# Patient Record
Sex: Female | Born: 2000 | Race: White | Hispanic: No | Marital: Married | Smoking: Never smoker
Health system: Southern US, Community
[De-identification: ages and names within clinical notes are randomized; demographics above are authoritative.]

## PROBLEM LIST (undated history)

## (undated) DIAGNOSIS — T7840XA Allergy, unspecified, initial encounter: Secondary | ICD-10-CM

## (undated) DIAGNOSIS — L709 Acne, unspecified: Secondary | ICD-10-CM

## (undated) DIAGNOSIS — F32A Depression, unspecified: Secondary | ICD-10-CM

## (undated) DIAGNOSIS — Z9189 Other specified personal risk factors, not elsewhere classified: Secondary | ICD-10-CM

## (undated) DIAGNOSIS — J99 Respiratory disorders in diseases classified elsewhere: Secondary | ICD-10-CM

## (undated) HISTORY — DX: Depression, unspecified: F32.A

## (undated) HISTORY — PX: APPENDECTOMY: SHX54

## (undated) HISTORY — PX: TONSILLECTOMY: SUR1361

## (undated) HISTORY — PX: WISDOM TOOTH EXTRACTION: SHX21

## (undated) HISTORY — PX: SHOULDER SURGERY: SHX246

---
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
## 2018-12-27 ENCOUNTER — Other Ambulatory Visit: Payer: Self-pay

## 2018-12-27 DIAGNOSIS — Z20822 Contact with and (suspected) exposure to covid-19: Secondary | ICD-10-CM

## 2018-12-28 LAB — NOVEL CORONAVIRUS, NAA: SARS-CoV-2, NAA: NOT DETECTED

## 2019-05-07 ENCOUNTER — Other Ambulatory Visit: Payer: Self-pay

## 2019-05-07 ENCOUNTER — Ambulatory Visit
Admission: EM | Admit: 2019-05-07 | Discharge: 2019-05-07 | Disposition: A | Discharge status: Home / Self Care | Payer: Managed Care, Other (non HMO) | Attending: Family Medicine | Admitting: Family Medicine

## 2019-05-07 ENCOUNTER — Encounter: Payer: Self-pay | Admitting: Emergency Medicine

## 2019-05-07 DIAGNOSIS — N920 Excessive and frequent menstruation with regular cycle: Secondary | ICD-10-CM

## 2019-05-07 LAB — CBC WITH DIFFERENTIAL/PLATELET
Abs Immature Granulocytes: 0.02 10*3/uL (ref 0.00–0.07)
Basophils Absolute: 0 10*3/uL (ref 0.0–0.1)
Basophils Relative: 0 %
Eosinophils Absolute: 0.1 10*3/uL (ref 0.0–0.5)
Eosinophils Relative: 1 %
HCT: 42.8 % (ref 36.0–46.0)
Hemoglobin: 14.5 g/dL (ref 12.0–15.0)
Immature Granulocytes: 0 %
Lymphocytes Relative: 30 %
Lymphs Abs: 1.9 10*3/uL (ref 0.7–4.0)
MCH: 31.5 pg (ref 26.0–34.0)
MCHC: 33.9 g/dL (ref 30.0–36.0)
MCV: 92.8 fL (ref 80.0–100.0)
Monocytes Absolute: 0.5 10*3/uL (ref 0.1–1.0)
Monocytes Relative: 7 %
Neutro Abs: 3.9 10*3/uL (ref 1.7–7.7)
Neutrophils Relative %: 62 %
Platelets: 259 10*3/uL (ref 150–400)
RBC: 4.61 MIL/uL (ref 3.87–5.11)
RDW: 12.2 % (ref 11.5–15.5)
WBC: 6.4 10*3/uL (ref 4.0–10.5)
nRBC: 0 % (ref 0.0–0.2)

## 2019-05-07 LAB — PREGNANCY, URINE: Preg Test, Ur: NEGATIVE

## 2019-05-07 MED ORDER — NAPROXEN 500 MG PO TABS: 500.0000 mg | ORAL_TABLET | Freq: Two times a day (BID) | ORAL | 0 refills | Status: DC

## 2019-05-07 NOTE — ED Triage Notes (Signed)
Patient stated her period is supposed to start today but yesterday she started having quarter sized clots and then her period started today. She talked with her PCP and they advised her to come here for an ultrasound. Patient c/o lower right side back pain that started yesterday.

## 2019-05-10 NOTE — ED Provider Notes (Signed)
[facility]    CSN: [id number] Arrival date & time: 05/07/19  1341      History   Chief Complaint Chief Complaint  Patient presents with  . Menorrhagia    HPI Ruth Werner is a 19 y.o. female.   19 yo female with a c/o heavy period since yesterday. States she's passed quarter sized clots. States she takes birth control tablets and was supposed to start her period today but started one day earlier and very heavy. Denies any dizziness, shortness of breath, abdominal or pelvic pains, fevers, chills. States she has felt some right sided low back pain.      Past Medical History:  Diagnosis Date  . Acne   . 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   Past Surgical History:  Procedure Laterality Date  . APPENDECTOMY    . SHOULDER SURGERY    . TONSILLECTOMY      OB History   No obstetric history on file.      Home Medications    Prior to Admission medications   Medication Sig Start Date End Date Taking? Authorizing Provider  norethindrone-ethinyl estradiol-iron (ESTROSTEP FE,TILIA FE,TRI-LEGEST FE) 1-20/1-30/1-35 MG-MCG tablet Take 1 tablet by mouth daily.   Yes [provider]  amoxicillin (AMOXIL) 500 MG capsule Take 1 capsule (500 mg total) by mouth 2 (two) times daily. 07/22/17   Renford Dills, NP  EPINEPHrine (EPIPEN 2-PAK) 0.3 mg/0.3 mL IJ SOAJ injection Inject 0.3 mLs (0.3 mg total) into the muscle once. 10/26/14   Jennye Moccasin, MD  naproxen (NAPROSYN) 500 MG tablet Take 1 tablet (500 mg total) by mouth 2 (two) times daily. 05/07/19   Payton Mccallum, MD    Family History History reviewed. No pertinent family history.  Social History Social History   Tobacco Use  . Smoking status: Never Smoker  . Smokeless tobacco: Never Used  Substance Use Topics  . Alcohol use: No  . Drug use: No     Allergies   Patient has no known  allergies.   Review of Systems Review of Systems   Physical Exam Triage Vital Signs ED Triage Vitals  Enc Vitals Group     BP 05/07/19 1402 108/72     Pulse Rate 05/07/19 1402 80     Resp 05/07/19 1402 18     Temp 05/07/19 1402 98.3 F (36.8 C)     Temp Source 05/07/19 1402 Oral     SpO2 05/07/19 1402 100 %     Weight 05/07/19 1400 190 lb (86.2 kg)     Height 05/07/19 1400 6\' 1"  (1.854 m)     Head Circumference --      Peak Flow --      Pain Score 05/07/19 1400 3     Pain Loc --      Pain Edu? --      Excl. in GC? --    No data found.  Updated Vital Signs BP 108/72 (BP Location: Right Arm)   Pulse 80   Temp 98.3 F (36.8 C) (Oral)   Resp 18   Ht 6\' 1"  (1.854 m)   Wt 86.2 kg   LMP 05/07/2019   SpO2 100%   BMI 25.07 kg/m   Visual Acuity Right Eye Distance:   Left Eye Distance:   Bilateral Distance:    Right Eye Near:   Left Eye Near:  Bilateral Near:     Physical Exam Vitals and nursing note reviewed.  Constitutional:      General: She is not in acute distress.    Appearance: She is not diaphoretic.  Cardiovascular:     Rate and Rhythm: Normal rate.     Heart sounds: Normal heart sounds.  Pulmonary:     Effort: Pulmonary effort is normal. No respiratory distress.  Abdominal:     General: Bowel sounds are normal. There is no distension.     Palpations: Abdomen is soft. There is no mass.     Tenderness: There is no abdominal tenderness. There is no right CVA tenderness, left CVA tenderness, guarding or rebound.     Hernia: No hernia is present.  Neurological:     Mental Status: She is alert.      UC Treatments / Results  Labs (all labs ordered are listed, but only abnormal results are displayed) Labs Reviewed  CBC WITH DIFFERENTIAL/PLATELET  PREGNANCY, URINE    EKG   Radiology No results found.  Procedures Procedures (including critical care time)  Medications Ordered in UC Medications - No data to display  Initial Impression /  Assessment and Plan / UC Course  I have reviewed the triage vital signs and the nursing notes.  Pertinent labs & imaging results that were available during my care of the patient were reviewed by me and considered in my medical decision making (see chart for details).      Final Clinical Impressions(s) / UC Diagnoses   Final diagnoses:  Menorrhagia with regular cycle    ED Prescriptions    Medication Sig Dispense Auth. Provider   naproxen (NAPROSYN) 500 MG tablet Take 1 tablet (500 mg total) by mouth 2 (two) times daily. 30 tablet Norval Gable, MD      1. Lab results and diagnosis reviewed with patient 2. rx as per orders above; reviewed possible side effects, interactions, risks and benefits  3. Follow up with PCP and/or GYN next week  4. Follow-up prn if symptoms worsen or don't improve  PDMP not reviewed this encounter.   Norval Gable, MD 05/10/19 1058

## 2019-07-25 ENCOUNTER — Encounter: Payer: Self-pay | Admitting: Emergency Medicine

## 2019-07-25 ENCOUNTER — Ambulatory Visit
Admission: EM | Admit: 2019-07-25 | Discharge: 2019-07-25 | Disposition: A | Discharge status: Home / Self Care | Payer: Managed Care, Other (non HMO) | Attending: Family Medicine | Admitting: Family Medicine

## 2019-07-25 ENCOUNTER — Other Ambulatory Visit: Payer: Self-pay

## 2019-07-25 DIAGNOSIS — H6503 Acute serous otitis media, bilateral: Secondary | ICD-10-CM

## 2019-07-25 DIAGNOSIS — J0100 Acute maxillary sinusitis, unspecified: Secondary | ICD-10-CM | POA: Diagnosis not present

## 2019-07-25 DIAGNOSIS — J0110 Acute frontal sinusitis, unspecified: Secondary | ICD-10-CM

## 2019-07-25 MED ORDER — AMOXICILLIN 875 MG PO TABS: 875.0000 mg | ORAL_TABLET | Freq: Two times a day (BID) | ORAL | 0 refills | Status: DC

## 2019-07-25 NOTE — Discharge Instructions (Signed)
Over the counter steroid nose spray

## 2019-07-25 NOTE — ED Triage Notes (Signed)
Patient c/o left ear pain for the past 4 days.  Patient also reports nasal congestion prior to her left ear pain.  Patient denies fevers.

## 2019-07-25 NOTE — ED Provider Notes (Signed)
[facility]    CSN: [id number] Arrival date & time: 07/25/19  1222      History   Chief Complaint Chief Complaint  Patient presents with  . Otalgia    ;eft    HPI Ruth Werner is a 19 y.o. female.   19 yo female with a c/o bilateral ear pain for the past 4 days as well as nasal congestion, sinus pressure and headache for the past week. Denies any fevers, chills, cough, shortness of breath. Has tried otc oral meds without relief.    Otalgia   Past Medical History:  Diagnosis Date  . Acne   . 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   Past Surgical History:  Procedure Laterality Date  . APPENDECTOMY    . SHOULDER SURGERY    . TONSILLECTOMY      OB History   No obstetric history on file.      Home Medications    Prior to Admission medications   Medication Sig Start Date End Date Taking? Authorizing Provider  norethindrone-ethinyl estradiol-iron (ESTROSTEP FE,TILIA FE,TRI-LEGEST FE) 1-20/1-30/1-35 MG-MCG tablet Take 1 tablet by mouth daily.   Yes [provider]  amoxicillin (AMOXIL) 875 MG tablet Take 1 tablet (875 mg total) by mouth 2 (two) times daily. 07/25/19   Payton Mccallum, MD  EPINEPHrine (EPIPEN 2-PAK) 0.3 mg/0.3 mL IJ SOAJ injection Inject 0.3 mLs (0.3 mg total) into the muscle once. 10/26/14   Jennye Moccasin, MD  naproxen (NAPROSYN) 500 MG tablet Take 1 tablet (500 mg total) by mouth 2 (two) times daily. 05/07/19   Payton Mccallum, MD    Family History Family History  Problem Relation Age of Onset  . Healthy Mother   . Healthy Father     Social History Social History   Tobacco Use  . Smoking status: Never Smoker  . Smokeless tobacco: Never Used  Vaping Use  . Vaping Use: Never used  Substance Use Topics  . Alcohol use: No  . Drug use: No     Allergies   Patient has no known allergies.   Review of  Systems Review of Systems  HENT: Positive for ear pain.      Physical Exam Triage Vital Signs ED Triage Vitals  Enc Vitals Group     BP 07/25/19 1246 111/70     Pulse Rate 07/25/19 1246 88     Resp 07/25/19 1246 14     Temp 07/25/19 1246 98.6 F (37 C)     Temp Source 07/25/19 1246 Oral     SpO2 07/25/19 1246 100 %     Weight 07/25/19 1244 190 lb (86.2 kg)     Height 07/25/19 1244 6\' 1"  (1.854 m)     Head Circumference --      Peak Flow --      Pain Score 07/25/19 1244 3     Pain Loc --      Pain Edu? --      Excl. in GC? --    No data found.  Updated Vital Signs BP 111/70 (BP Location: Left Arm)   Pulse 88   Temp 98.6 F (37 C) (Oral)   Resp 14   Ht 6\' 1"  (1.854 m)   Wt 86.2 kg   SpO2 100%   BMI 25.07 kg/m   Visual Acuity Right Eye Distance:   Left Eye Distance:  Bilateral Distance:    Right Eye Near:   Left Eye Near:    Bilateral Near:     Physical Exam Vitals and nursing note reviewed.  Constitutional:      General: She is not in acute distress.    Appearance: She is not toxic-appearing or diaphoretic.  HENT:     Right Ear: A middle ear effusion is present. Tympanic membrane is erythematous and bulging.     Left Ear: A middle ear effusion is present. Tympanic membrane is erythematous and bulging.     Nose:     Right Sinus: Maxillary sinus tenderness and frontal sinus tenderness present.     Left Sinus: Maxillary sinus tenderness and frontal sinus tenderness present.     Mouth/Throat:     Pharynx: Uvula midline. Posterior oropharyngeal erythema present. No oropharyngeal exudate.  Cardiovascular:     Rate and Rhythm: Normal rate.     Heart sounds: Normal heart sounds.  Pulmonary:     Effort: Pulmonary effort is normal. No respiratory distress.  Musculoskeletal:     Cervical back: Neck supple.  Neurological:     Mental Status: She is alert.      UC Treatments / Results  Labs (all labs ordered are listed, but only abnormal results are  displayed) Labs Reviewed - No data to display  EKG   Radiology No results found.  Procedures Procedures (including critical care time)  Medications Ordered in UC Medications - No data to display  Initial Impression / Assessment and Plan / UC Course  I have reviewed the triage vital signs and the nursing notes.  Pertinent labs & imaging results that were available during my care of the patient were reviewed by me and considered in my medical decision making (see chart for details).      Final Clinical Impressions(s) / UC Diagnoses   Final diagnoses:  Bilateral acute serous otitis media, recurrence not specified  Acute maxillary sinusitis, recurrence not specified  Acute frontal sinusitis, recurrence not specified     Discharge Instructions     Over the counter steroid nose spray    ED Prescriptions    Medication Sig Dispense Auth. Provider   amoxicillin (AMOXIL) 875 MG tablet Take 1 tablet (875 mg total) by mouth 2 (two) times daily. 20 tablet Payton Mccallum, MD      1.diagnosis reviewed with patient 2. rx as per orders above; reviewed possible side effects, interactions, risks and benefits  3. Recommend supportive treatment as above 4. Follow-up prn if symptoms worsen or don't improve  PDMP not reviewed this encounter.   Payton Mccallum, MD 07/25/19 [id number]

## 2019-10-09 ENCOUNTER — Ambulatory Visit (INDEPENDENT_AMBULATORY_CARE_PROVIDER_SITE_OTHER): Payer: Managed Care, Other (non HMO) | Admitting: Advanced Practice Midwife

## 2019-10-09 ENCOUNTER — Other Ambulatory Visit (HOSPITAL_COMMUNITY)
Admission: RE | Admit: 2019-10-09 | Discharge: 2019-10-09 | Disposition: A | Discharge status: Home / Self Care | Payer: Managed Care, Other (non HMO) | Source: Ambulatory Visit | Attending: Advanced Practice Midwife | Admitting: Advanced Practice Midwife

## 2019-10-09 ENCOUNTER — Other Ambulatory Visit: Payer: Self-pay

## 2019-10-09 ENCOUNTER — Encounter: Payer: Self-pay | Admitting: Advanced Practice Midwife

## 2019-10-09 VITALS — BP 122/70 | Ht 73.0 in | Wt 197.0 lb

## 2019-10-09 DIAGNOSIS — Z124 Encounter for screening for malignant neoplasm of cervix: Secondary | ICD-10-CM | POA: Insufficient documentation

## 2019-10-09 DIAGNOSIS — Z01419 Encounter for gynecological examination (general) (routine) without abnormal findings: Secondary | ICD-10-CM

## 2019-10-09 DIAGNOSIS — Z3041 Encounter for surveillance of contraceptive pills: Secondary | ICD-10-CM

## 2019-10-09 MED ORDER — ESTARYLLA 0.25-35 MG-MCG PO TABS: 1.0000 | ORAL_TABLET | Freq: Every day | ORAL | 4 refills | Status: DC

## 2019-10-09 NOTE — Progress Notes (Signed)
Gynecology Annual Exam  PCP: Patient, No Pcp Per  Chief Complaint:  Chief Complaint  Patient presents with  . Annual Exam    History of Present Illness: Patient is a 19 y.o. G0P0000 presents for annual exam. The patient has no gyn complaints today. She has ongoing fatigue- "feels tired all the time". She has seen her PCP and reports that all labs have been normal. She is now taking vitamin B12. We discussed possible environmental reasons such as; exposure to mold or some other sensitivities. She may want to consider allergy testing.   LMP: Patient's last menstrual period was 10/05/2019. Menarche:10 Average Interval: regular, 28 days Duration of flow: 5-6 days Heavy Menses: first 2 days Clots: no Intermenstrual Bleeding: no Postcoital Bleeding: no Dysmenorrhea: no  The patient is sexually active. She currently uses OCP (estrogen/progesterone) for contraception. She denies dyspareunia.  The patient does perform self breast exams.  There is no notable family history of breast or ovarian cancer in her family.  The patient wears seatbelts: yes.  The patient has regular exercise: she is active at her job and walks her dog daily. She reports healthy lifestyle; diet, hydration and sleep.    The patient denies current symptoms of depression.    Review of Systems: Review of Systems  Constitutional: Positive for malaise/fatigue. Negative for chills and fever.  HENT: Negative for congestion, ear discharge, ear pain, hearing loss, sinus pain and sore throat.   Eyes: Negative for blurred vision and double vision.  Respiratory: Negative for cough, shortness of breath and wheezing.   Cardiovascular: Negative for chest pain, palpitations and leg swelling.  Gastrointestinal: Negative for abdominal pain, blood in stool, constipation, diarrhea, heartburn, melena, nausea and vomiting.  Genitourinary: Negative for dysuria, flank pain, frequency, hematuria and urgency.  Musculoskeletal: Negative  for back pain, joint pain and myalgias.  Skin: Negative for itching and rash.  Neurological: Negative for dizziness, tingling, tremors, sensory change, speech change, focal weakness, seizures, loss of consciousness, weakness and headaches.  Endo/Heme/Allergies: Negative for environmental allergies. Does not bruise/bleed easily.  Psychiatric/Behavioral: Negative for depression, hallucinations, memory loss, substance abuse and suicidal ideas. The patient is not nervous/anxious and does not have insomnia.     Past Medical History:  There are no problems to display for this patient.   Past Surgical History:  Past Surgical History:  Procedure Laterality Date  . APPENDECTOMY    . SHOULDER SURGERY    . TONSILLECTOMY      Gynecologic History:  Patient's last menstrual period was 10/05/2019. Contraception: OCP (estrogen/progesterone) Last Pap: First PAP is today  Obstetric History: G0P0000  Family History:  Family History  Problem Relation Age of Onset  . Healthy Mother   . Healthy Father     Social History:  Social History   Socioeconomic History  . Marital status: Single    Spouse name: Not on file  . Number of children: Not on file  . Years of education: Not on file  . Highest education level: Not on file  Occupational History  . Not on file  Tobacco Use  . Smoking status: Never Smoker  . Smokeless tobacco: Never Used  Vaping Use  . Vaping Use: Never used  Substance and Sexual Activity  . Alcohol use: No  . Drug use: No  . Sexual activity: Yes    Birth control/protection: Pill  Other Topics Concern  . Not on file  Social History Narrative  . Not on file   Social Determinants  of Health   Financial Resource Strain:   . Difficulty of Paying Living Expenses: Not on file  Food Insecurity:   . Worried About Programme researcher, broadcasting/film/video in the Last Year: Not on file  . Ran Out of Food in the Last Year: Not on file  Transportation Needs:   . Lack of Transportation  (Medical): Not on file  . Lack of Transportation (Non-Medical): Not on file  Physical Activity:   . Days of Exercise per Week: Not on file  . Minutes of Exercise per Session: Not on file  Stress:   . Feeling of Stress : Not on file  Social Connections:   . Frequency of Communication with Friends and Family: Not on file  . Frequency of Social Gatherings with Friends and Family: Not on file  . Attends Religious Services: Not on file  . Active Member of Clubs or Organizations: Not on file  . Attends Banker Meetings: Not on file  . Marital Status: Not on file  Intimate Partner Violence:   . Fear of Current or Ex-Partner: Not on file  . Emotionally Abused: Not on file  . Physically Abused: Not on file  . Sexually Abused: Not on file    Allergies:  No Known Allergies  Medications: Prior to Admission medications   Medication Sig Start Date End Date Taking? Authorizing Provider  cyanocobalamin (,VITAMIN B-12,) 1000 MCG/ML injection Inject into the muscle. 09/05/19 08/30/20 Yes [provider]  EPINEPHrine (EPIPEN 2-PAK) 0.3 mg/0.3 mL IJ SOAJ injection Inject 0.3 mLs (0.3 mg total) into the muscle once. 10/26/14  Yes Jennye Moccasin, MD  ESTARYLLA 0.25-35 MG-MCG tablet Take 1 tablet by mouth daily. 10/09/19   Tresea Mall, CNM    Physical Exam Vitals: Blood pressure 122/70, height 6\' 1"  (1.854 m), weight 197 lb (89.4 kg), last menstrual period 10/05/2019.  General: NAD HEENT: normocephalic, anicteric Thyroid: no enlargement, no palpable nodules Pulmonary: No increased work of breathing, CTAB Cardiovascular: RRR, distal pulses 2+ Breast: Breast symmetrical, no tenderness, no palpable nodules or masses, no skin or nipple retraction present, no nipple discharge.  No axillary or supraclavicular lymphadenopathy. Abdomen: NABS, soft, non-tender, non-distended.  Umbilicus without lesions.  No hepatomegaly, splenomegaly or masses palpable. No evidence of hernia    Genitourinary:  External: Normal external female genitalia.  Normal urethral meatus, normal Bartholin's and Skene's glands.    Vagina: Normal vaginal mucosa, no evidence of prolapse.    Cervix: Grossly normal in appearance, no bleeding, no CMT  Uterus: Non-enlarged, mobile, normal contour.    Adnexa: ovaries non-enlarged, no adnexal masses  Rectal: deferred  Lymphatic: no evidence of inguinal lymphadenopathy Extremities: no edema, erythema, or tenderness Neurologic: Grossly intact Psychiatric: mood appropriate, affect full    Assessment: 19 y.o. G0P0000 routine annual exam  Plan: Problem List Items Addressed This Visit    None    Visit Diagnoses    Well woman exam with routine gynecological exam    -  Primary   Relevant Orders   Cytology - PAP   Encounter for surveillance of contraceptive pills       Relevant Medications   ESTARYLLA 0.25-35 MG-MCG tablet   Cervical cancer screening       Relevant Orders   Cytology - PAP      1) 4) Gardasil Series discussed and if applicable offered to patient - Patient has not previously completed 3 shot series   2) STI screening  was offered and declined  3)  ASCCP  guidelines and rationale discussed.  Patient opts for beginning screening today and 3 year interval  4) Contraception - the patient is currently using  OCP (estrogen/progesterone).  She is happy with her current form of contraception and plans to continue We discussed safe sex practices to reduce her furture risk of STI's.    5) Return in about 1 year (around 10/08/2020) for annual established gyn.    Tresea Mall, CNM [facility] 10/09/2019, 4:59 PM

## 2019-10-09 NOTE — Patient Instructions (Signed)
Health Maintenance, Female Adopting a healthy lifestyle and getting preventive care are important in promoting health and wellness. Ask your health care provider about:  The right schedule for you to have regular tests and exams.  Things you can do on your own to prevent diseases and keep yourself healthy. What should I know about diet, weight, and exercise? Eat a healthy diet   Eat a diet that includes plenty of vegetables, fruits, low-fat dairy products, and lean protein.  Do not eat a lot of foods that are high in solid fats, added sugars, or sodium. Maintain a healthy weight Body mass index (BMI) is used to identify weight problems. It estimates body fat based on height and weight. Your health care provider can help determine your BMI and help you achieve or maintain a healthy weight. Get regular exercise Get regular exercise. This is one of the most important things you can do for your health. Most adults should:  Exercise for at least 150 minutes each week. The exercise should increase your heart rate and make you sweat (moderate-intensity exercise).  Do strengthening exercises at least twice a week. This is in addition to the moderate-intensity exercise.  Spend less time sitting. Even light physical activity can be beneficial. Watch cholesterol and blood lipids Have your blood tested for lipids and cholesterol at 20 years of age, then have this test every 5 years. Have your cholesterol levels checked more often if:  Your lipid or cholesterol levels are high.  You are older than 21 years of age.  You are at high risk for heart disease. What should I know about cancer screening? Depending on your health history and family history, you may need to have cancer screening at various ages. This may include screening for:  Breast cancer.  Cervical cancer.  Colorectal cancer.  Skin cancer.  Lung cancer. What should I know about heart disease, diabetes, and high blood  pressure? Blood pressure and heart disease  High blood pressure causes heart disease and increases the risk of stroke. This is more likely to develop in people who have high blood pressure readings, are of African descent, or are overweight.  Have your blood pressure checked: ? Every 3-5 years if you are 18-39 years of age. ? Every year if you are 40 years old or older. Diabetes Have regular diabetes screenings. This checks your fasting blood sugar level. Have the screening done:  Once every three years after age 40 if you are at a normal weight and have a low risk for diabetes.  More often and at a younger age if you are overweight or have a high risk for diabetes. What should I know about preventing infection? Hepatitis B If you have a higher risk for hepatitis B, you should be screened for this virus. Talk with your health care provider to find out if you are at risk for hepatitis B infection. Hepatitis C Testing is recommended for:  Everyone born from 1945 through 1965.  Anyone with known risk factors for hepatitis C. Sexually transmitted infections (STIs)  Get screened for STIs, including gonorrhea and chlamydia, if: ? You are sexually active and are younger than 21 years of age. ? You are older than 21 years of age and your health care provider tells you that you are at risk for this type of infection. ? Your sexual activity has changed since you were last screened, and you are at increased risk for chlamydia or gonorrhea. Ask your health care provider if   you are at risk.  Ask your health care provider about whether you are at high risk for HIV. Your health care provider may recommend a prescription medicine to help prevent HIV infection. If you choose to take medicine to prevent HIV, you should first get tested for HIV. You should then be tested every 3 months for as long as you are taking the medicine. Pregnancy  If you are about to stop having your period (premenopausal) and  you may become pregnant, seek counseling before you get pregnant.  Take 400 to 800 micrograms (mcg) of folic acid every day if you become pregnant.  Ask for birth control (contraception) if you want to prevent pregnancy. Osteoporosis and menopause Osteoporosis is a disease in which the bones lose minerals and strength with aging. This can result in bone fractures. If you are 65 years old or older, or if you are at risk for osteoporosis and fractures, ask your health care provider if you should:  Be screened for bone loss.  Take a calcium or vitamin D supplement to lower your risk of fractures.  Be given hormone replacement therapy (HRT) to treat symptoms of menopause. Follow these instructions at home: Lifestyle  Do not use any products that contain nicotine or tobacco, such as cigarettes, e-cigarettes, and chewing tobacco. If you need help quitting, ask your health care provider.  Do not use street drugs.  Do not share needles.  Ask your health care provider for help if you need support or information about quitting drugs. Alcohol use  Do not drink alcohol if: ? Your health care provider tells you not to drink. ? You are pregnant, may be pregnant, or are planning to become pregnant.  If you drink alcohol: ? Limit how much you use to 0-1 drink a day. ? Limit intake if you are breastfeeding.  Be aware of how much alcohol is in your drink. In the [place] one drink equals one 12 oz bottle of beer (355 mL), one 5 oz glass of wine (148 mL), or one 1 oz glass of hard liquor (44 mL). General instructions  Schedule regular health, dental, and eye exams.  Stay current with your vaccines.  Tell your health care provider if: ? You often feel depressed. ? You have ever been abused or do not feel safe at home. Summary  Adopting a healthy lifestyle and getting preventive care are important in promoting health and wellness.  Follow your health care provider's instructions about healthy  diet, exercising, and getting tested or screened for diseases.  Follow your health care provider's instructions on monitoring your cholesterol and blood pressure. This information is not intended to replace advice given to you by your health care provider. Make sure you discuss any questions you have with your health care provider. Document Revised: 12/26/2017 Document Reviewed: 12/26/2017 Elsevier Patient Education  2020 [employer]  

## 2019-10-13 LAB — CYTOLOGY - PAP: Diagnosis: NEGATIVE

## 2019-10-14 ENCOUNTER — Ambulatory Visit: Payer: Managed Care, Other (non HMO) | Admitting: Obstetrics and Gynecology

## 2019-10-14 ENCOUNTER — Telehealth: Payer: Self-pay

## 2019-10-14 MED ORDER — FLUCONAZOLE 150 MG PO TABS: 150.0000 mg | ORAL_TABLET | Freq: Once | ORAL | 0 refills | Status: AC

## 2019-10-14 NOTE — Telephone Encounter (Signed)
Pt had yeast on pap result from 10/09/19. Rx diflucan eRxd. F/u prn.

## 2019-10-14 NOTE — Telephone Encounter (Signed)
Pt has an appt at 1130 for yeast inf sx: discharge, itching, irritation, sour odor. Says sx started 10/09/19.

## 2019-10-14 NOTE — Addendum Note (Signed)
Addended by: Althea Grimmer B on: 10/14/2019 11:11 AM   Modules accepted: Orders

## 2019-12-31 ENCOUNTER — Telehealth: Payer: Self-pay

## 2019-12-31 NOTE — Telephone Encounter (Signed)
Spoke w/patient. She isn't trying/just not preventing and not having any symptoms. Advised per JEG she is probably adjusting to the change in hormones. we can do beta level if she is having symptoms or just very anxious, although the urine test is usually pretty accurate. Patient prefers to give it a few more weeks and will call back to schedule if needed.

## 2019-12-31 NOTE — Telephone Encounter (Signed)
Patient stopped birth control about 3 weeks ago. Her period is two weeks late. She has had 4 negative hpts. Inquiring if she needs to get a blood test or what she should do. QZ#[phone]

## 2020-03-09 ENCOUNTER — Telehealth: Payer: Self-pay

## 2020-03-09 NOTE — Telephone Encounter (Signed)
Please advise 

## 2020-03-09 NOTE — Telephone Encounter (Signed)
Pt quit her BC in November, still no period and negative test. Please advise

## 2020-03-11 NOTE — Telephone Encounter (Signed)
I sent the patient a message addressing her questions.

## 2020-03-19 ENCOUNTER — Ambulatory Visit
Admission: EM | Admit: 2020-03-19 | Discharge: 2020-03-19 | Disposition: A | Discharge status: Home / Self Care | Payer: PRIVATE HEALTH INSURANCE | Attending: Emergency Medicine | Admitting: Emergency Medicine

## 2020-03-19 ENCOUNTER — Other Ambulatory Visit: Payer: Self-pay

## 2020-03-19 ENCOUNTER — Encounter: Payer: Self-pay | Admitting: Emergency Medicine

## 2020-03-19 DIAGNOSIS — Z79899 Other long term (current) drug therapy: Secondary | ICD-10-CM | POA: Insufficient documentation

## 2020-03-19 DIAGNOSIS — Z20822 Contact with and (suspected) exposure to covid-19: Secondary | ICD-10-CM | POA: Insufficient documentation

## 2020-03-19 DIAGNOSIS — J029 Acute pharyngitis, unspecified: Secondary | ICD-10-CM | POA: Diagnosis present

## 2020-03-19 DIAGNOSIS — J069 Acute upper respiratory infection, unspecified: Secondary | ICD-10-CM | POA: Insufficient documentation

## 2020-03-19 LAB — GROUP A STREP BY PCR: Group A Strep by PCR: NOT DETECTED

## 2020-03-19 MED ORDER — BENZONATATE 100 MG PO CAPS: 200.0000 mg | ORAL_CAPSULE | Freq: Three times a day (TID) | ORAL | 0 refills | Status: DC

## 2020-03-19 MED ORDER — PROMETHAZINE-DM 6.25-15 MG/5ML PO SYRP: 5.0000 mL | ORAL_SOLUTION | Freq: Four times a day (QID) | ORAL | 0 refills | Status: DC | PRN

## 2020-03-19 NOTE — ED Provider Notes (Signed)
[facility]    CSN: [id number] Arrival date & time: 03/19/20  1128      History   Chief Complaint Chief Complaint  Patient presents with  . Sore Throat  . Headache    HPI Ruth Werner is a 20 y.o. female.   HPI   20 year old female here for evaluation of cold symptoms.  Patient reports that she developed a sore throat, headache, and nonproductive cough 2 days ago.  This morning she awoke with a fever of 100.8 that she took Tylenol and ibuprofen for.  She is had associated symptoms of a runny nose, sneezing, headache, wheezing, and reports that her cough is worse at night when she lays down.  Patient denies ear pain or pressure, shortness of breath, GI complaints, body aches, or any known sick contacts.  Past Medical History:  Diagnosis Date  . Acne   . 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   Past Surgical History:  Procedure Laterality Date  . APPENDECTOMY    . SHOULDER SURGERY    . TONSILLECTOMY      OB History    Gravida  0   Para  0   Term  0   Preterm  0   AB  0   Living  0     SAB  0   IAB  0   Ectopic  0   Multiple  0   Live Births  0            Home Medications    Prior to Admission medications   Medication Sig Start Date End Date Taking? Authorizing Provider  amitriptyline (ELAVIL) 10 MG tablet Take by mouth. 03/03/20 03/03/21 Yes [provider]  benzonatate (TESSALON) 100 MG capsule Take 2 capsules (200 mg total) by mouth every 8 (eight) hours. 03/19/20  Yes Becky Augusta, NP  cyanocobalamin (,VITAMIN B-12,) 1000 MCG/ML injection Inject into the muscle. 09/05/19 08/30/20 Yes [provider]  promethazine-dextromethorphan (PROMETHAZINE-DM) 6.25-15 MG/5ML syrup Take 5 mLs by mouth 4 (four) times daily as needed. 03/19/20  Yes Becky Augusta, NP  EPINEPHrine (EPIPEN 2-PAK) 0.3 mg/0.3 mL IJ SOAJ injection  Inject 0.3 mLs (0.3 mg total) into the muscle once. 10/26/14   Jennye Moccasin, MD  ESTARYLLA 0.25-35 MG-MCG tablet Take 1 tablet by mouth daily. 10/09/19   Tresea Mall, CNM    Family History Family History  Problem Relation Age of Onset  . Healthy Mother   . Healthy Father     Social History Social History   Tobacco Use  . Smoking status: Never Smoker  . Smokeless tobacco: Never Used  Vaping Use  . Vaping Use: Never used  Substance Use Topics  . Alcohol use: No  . Drug use: No     Allergies   Patient has no known allergies.   Review of Systems Review of Systems  Constitutional: Positive for fever. Negative for activity change.  HENT: Positive for congestion, rhinorrhea and sore throat. Negative for ear pain.   Respiratory: Positive for cough and wheezing. Negative for shortness of breath.   Gastrointestinal: Negative for diarrhea, nausea and vomiting.  Musculoskeletal: Negative for arthralgias and myalgias.  Skin: Negative for rash.  Neurological: Positive for headaches.  Hematological: Negative.   Psychiatric/Behavioral: Negative.      Physical Exam Triage Vital Signs ED Triage Vitals  Enc Vitals Group  BP 03/19/20 1135 98/78     Pulse Rate 03/19/20 1135 73     Resp 03/19/20 1135 14     Temp 03/19/20 1135 98.2 F (36.8 C)     Temp Source 03/19/20 1135 Oral     SpO2 03/19/20 1135 99 %     Weight 03/19/20 1133 200 lb (90.7 kg)     Height 03/19/20 1133 6\' 1"  (1.854 m)     Head Circumference --      Peak Flow --      Pain Score 03/19/20 1133 4     Pain Loc --      Pain Edu? --      Excl. in GC? --    No data found.  Updated Vital Signs BP 98/78 (BP Location: Left Arm)   Pulse 73   Temp 98.2 F (36.8 C) (Oral)   Resp 14   Ht 6\' 1"  (1.854 m)   Wt 200 lb (90.7 kg)   LMP 03/05/2020 (Approximate)   SpO2 99%   BMI 26.39 kg/m   Visual Acuity Right Eye Distance:   Left Eye Distance:   Bilateral Distance:    Right Eye Near:   Left Eye  Near:    Bilateral Near:     Physical Exam Vitals and nursing note reviewed.  Constitutional:      General: She is not in acute distress.    Appearance: Normal appearance. She is normal weight.  HENT:     Head: Normocephalic and atraumatic.     Right Ear: Tympanic membrane, ear canal and external ear normal.     Left Ear: Tympanic membrane, ear canal and external ear normal.     Nose: Congestion and rhinorrhea present.     Mouth/Throat:     Mouth: Mucous membranes are moist.     Pharynx: Oropharynx is clear. No oropharyngeal exudate or posterior oropharyngeal erythema.  Cardiovascular:     Rate and Rhythm: Normal rate and regular rhythm.     Pulses: Normal pulses.     Heart sounds: Normal heart sounds. No murmur heard. No gallop.   Pulmonary:     Effort: Pulmonary effort is normal.     Breath sounds: Normal breath sounds. No wheezing, rhonchi or rales.  Musculoskeletal:     Cervical back: Normal range of motion and neck supple. No tenderness.  Lymphadenopathy:     Cervical: No cervical adenopathy.  Skin:    General: Skin is warm and dry.     Capillary Refill: Capillary refill takes less than 2 seconds.     Findings: No erythema.  Neurological:     General: No focal deficit present.     Mental Status: She is alert and oriented to person, place, and time.  Psychiatric:        Mood and Affect: Mood normal.        Behavior: Behavior normal.        Thought Content: Thought content normal.        Judgment: Judgment normal.      UC Treatments / Results  Labs (all labs ordered are listed, but only abnormal results are displayed) Labs Reviewed  GROUP A STREP BY PCR  SARS CORONAVIRUS 2 (TAT 6-24 HRS)    EKG   Radiology No results found.  Procedures Procedures (including critical care time)  Medications Ordered in UC Medications - No data to display  Initial Impression / Assessment and Plan / UC Course  I have reviewed the triage vital signs  and the nursing  notes.  Pertinent labs & imaging results that were available during my care of the patient were reviewed by me and considered in my medical decision making (see chart for details).   Patient is a very pleasant 20 year old female here for evaluation of cold symptoms that started 2 days ago and then worsened today when the fever started.  Patient is in no acute distress.  Physical exam reveals mildly erythematous edematous nasal mucosa with scant clear nasal discharge.  Posterior oropharynx is unremarkable and tonsillar pillars are surgically absent.  No cervical lymphadenopathy appreciated on exam.  Lungs are clear to auscultation all fields.  Covid and strep PCR collected at triage.  Strep PCR is negative.  Will discharge patient home with a diagnosis of viral URI.  Will treat with OTC Tylenol and NSAIDs, Tessalon Perles and Promethazine DM for cough, and have patient isolate pending the results of her Covid test.  Work note provided.  Final Clinical Impressions(s) / UC Diagnoses   Final diagnoses:  Viral URI with cough     Discharge Instructions     Isolate at home pending the results of your COVID test.  If you test positive then you will have to quarantine for 5 days from the start of your symptoms.  After 5 days you can break quarantine if your symptoms have improved and you have not had a fever for 24 hours without taking Tylenol or ibuprofen.  Use over-the-counter Tylenol and ibuprofen as needed for body aches and fever.  Use the Tessalon Perles during the day as needed for cough and the Promethazine DM cough syrup at nighttime as will make you drowsy.  If you develop any increased shortness of breath-especially at rest, you are unable to speak in full sentences, or is a late sign your lips are turning blue you need to go the ER for evaluation.     ED Prescriptions    Medication Sig Dispense Auth. Provider   benzonatate (TESSALON) 100 MG capsule Take 2 capsules (200 mg total) by  mouth every 8 (eight) hours. 21 capsule Becky Augusta, NP   promethazine-dextromethorphan (PROMETHAZINE-DM) 6.25-15 MG/5ML syrup Take 5 mLs by mouth 4 (four) times daily as needed. 118 mL Becky Augusta, NP     PDMP not reviewed this encounter.   Becky Augusta, NP 03/19/20 1226

## 2020-03-19 NOTE — Discharge Instructions (Addendum)
Isolate at home pending the results of your COVID test.  If you test positive then you will have to quarantine for 5 days from the start of your symptoms.  After 5 days you can break quarantine if your symptoms have improved and you have not had a fever for 24 hours without taking Tylenol or ibuprofen.  Use over-the-counter Tylenol and ibuprofen as needed for body aches and fever.  Use the Tessalon Perles during the day as needed for cough and the Promethazine DM cough syrup at nighttime as will make you drowsy.  If you develop any increased shortness of breath-especially at rest, you are unable to speak in full sentences, or is a late sign your lips are turning blue you need to go the ER for evaluation.  

## 2020-03-19 NOTE — ED Triage Notes (Signed)
Patient c/o sore throat, cough, and headache that started on Wed.  Patient states fever this morning.  Patient took home covid test and was negative.

## 2020-03-20 LAB — SARS CORONAVIRUS 2 (TAT 6-24 HRS): SARS Coronavirus 2: NEGATIVE

## 2020-04-08 DIAGNOSIS — M545 Low back pain, unspecified: Secondary | ICD-10-CM | POA: Insufficient documentation

## 2020-04-12 NOTE — Telephone Encounter (Signed)
Please advise 

## 2020-04-15 NOTE — Telephone Encounter (Signed)
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

## 2020-04-28 ENCOUNTER — Ambulatory Visit (INDEPENDENT_AMBULATORY_CARE_PROVIDER_SITE_OTHER): Payer: BC Managed Care – PPO | Admitting: Advanced Practice Midwife

## 2020-04-28 ENCOUNTER — Other Ambulatory Visit: Payer: Self-pay

## 2020-04-28 ENCOUNTER — Encounter: Payer: Self-pay | Admitting: Advanced Practice Midwife

## 2020-04-28 VITALS — BP 120/70 | Ht 73.0 in | Wt 209.0 lb

## 2020-04-28 DIAGNOSIS — N912 Amenorrhea, unspecified: Secondary | ICD-10-CM | POA: Diagnosis not present

## 2020-04-28 LAB — POCT URINE PREGNANCY: Preg Test, Ur: NEGATIVE

## 2020-04-28 MED ORDER — MEDROXYPROGESTERONE ACETATE 10 MG PO TABS
10.0000 mg | ORAL_TABLET | Freq: Every day | ORAL | 1 refills | Status: DC
Start: 2020-04-28 — End: 2020-09-13

## 2020-04-29 LAB — CBC
Hematocrit: 43.8 % (ref 34.0–46.6)
Hemoglobin: 14.3 g/dL (ref 11.1–15.9)
MCH: 30.2 pg (ref 26.6–33.0)
MCHC: 32.6 g/dL (ref 31.5–35.7)
MCV: 92 fL (ref 79–97)
Platelets: 241 10*3/uL (ref 150–450)
RBC: 4.74 x10E6/uL (ref 3.77–5.28)
RDW: 11.8 % (ref 11.7–15.4)
WBC: 4.7 10*3/uL (ref 3.4–10.8)

## 2020-04-29 LAB — PROGESTERONE: Progesterone: 0.3 ng/mL

## 2020-04-29 LAB — ESTRADIOL: Estradiol: 48.8 pg/mL

## 2020-04-29 LAB — FSH/LH
FSH: 9.6 m[IU]/mL
LH: 35.1 m[IU]/mL

## 2020-05-03 NOTE — Telephone Encounter (Signed)
Please advise 

## 2020-05-04 ENCOUNTER — Encounter: Payer: Self-pay | Admitting: Advanced Practice Midwife

## 2020-05-04 NOTE — Progress Notes (Signed)
Patient ID: Ruth Werner, female   DOB: [birth year], 20 y.o.   MRN: [id number]  Reason for Consult: Gynecologic Exam (Irregular periods)   Subjective:  HPI:  Ruth Werner is a 20 y.o. female being seen for issues of amenorrhea since stopping her birth control in November. She had a period in December and again in January and then none in March or so far in April. She has been trying to conceive for the past few months and has had all negative home pregnancy tests. Her periods began at age 71 and were monthly lasting 4-5 days. At age 41 she had a period lasting for 2 weeks which is when she started OCP for cycle control. She denies hirsutism or other evidence of irregular periods. She has been tracking cervical mucous and not tracking basal body temperature. We discussed checking hormone labs and taking progesterone challenge.  Past Medical History:  Diagnosis Date  . Acne   . 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 Healthy Mother   . Healthy Father    Past Surgical History:  Procedure Laterality Date  . APPENDECTOMY    . SHOULDER SURGERY    . TONSILLECTOMY      Short Social History:  Social History   Tobacco Use  . Smoking status: Never Smoker  . Smokeless tobacco: Never Used  Substance Use Topics  . Alcohol use: No    No Known Allergies  Current Outpatient Medications  Medication Sig Dispense Refill  . amitriptyline (ELAVIL) 10 MG tablet Take by mouth.    . medroxyPROGESTERone (PROVERA) 10 MG tablet Take 1 tablet (10 mg total) by mouth daily. Use for ten days 10 tablet 1  . cyanocobalamin (,VITAMIN B-12,) 1000 MCG/ML injection Inject into the muscle. (Patient not taking: Reported on 04/28/2020)    . EPINEPHrine (EPIPEN 2-PAK) 0.3 mg/0.3 mL IJ SOAJ injection Inject 0.3 mLs (0.3 mg total) into the muscle once. (Patient not taking: Reported on 04/28/2020) 1 Device 0  .  promethazine-dextromethorphan (PROMETHAZINE-DM) 6.25-15 MG/5ML syrup Take 5 mLs by mouth 4 (four) times daily as needed. (Patient not taking: Reported on 04/28/2020) 118 mL 0   No current facility-administered medications for this visit.    Review of Systems  Constitutional: Positive for malaise/fatigue. Negative for chills and fever.  HENT: Negative for congestion, ear discharge, ear pain, hearing loss, sinus pain and sore throat.   Eyes: Negative for blurred vision and double vision.  Respiratory: Negative for cough, shortness of breath and wheezing.   Cardiovascular: Negative for chest pain, palpitations and leg swelling.  Gastrointestinal: Negative for abdominal pain, blood in stool, constipation, diarrhea, heartburn, melena, nausea and vomiting.  Genitourinary: Negative for dysuria, flank pain, frequency, hematuria and urgency.  Musculoskeletal: Negative for back pain, joint pain and myalgias.  Skin: Negative for itching and rash.  Neurological: Negative for dizziness, tingling, tremors, sensory change, speech change, focal weakness, seizures, loss of consciousness, weakness and headaches.  Endo/Heme/Allergies: Positive for environmental allergies. Does not bruise/bleed easily.  Psychiatric/Behavioral: Negative for depression, hallucinations, memory loss, substance abuse and suicidal ideas. The patient is not nervous/anxious and does not have insomnia.         Objective:  Objective   Vitals:   04/28/20 0950  BP: 120/70  Weight: 209 lb (94.8 kg)  Height: 6\' 1"  (1.854 m)   Body mass index is 27.57 kg/m. Constitutional: Well nourished, well  developed female in no acute distress.  HEENT: normal Skin: Warm and dry.  Cardiovascular: Regular rate and rhythm.   Extremities: no edema Respiratory: Clear to auscultation bilateral. Normal respiratory effort Neuro: DTRs 2+, Cranial nerves grossly intact Psych: Alert and Oriented x3. No memory deficits. Normal mood and affect.  MS:  normal gait, normal bilateral lower extremity ROM/strength/stability.   Data: UPT: negative   Update to visit: Results for DAVINE, SWENEY (MRN [id number]) as of 05/04/2020 12:43  Ref. Range 04/28/2020 10:28  WBC Latest Ref Range: 3.4 - 10.8 x10E3/uL 4.7  RBC Latest Ref Range: 3.77 - 5.28 x10E6/uL 4.74  Hemoglobin Latest Ref Range: 11.1 - 15.9 g/dL 86.7  HCT Latest Ref Range: 34.0 - 46.6 % 43.8  MCV Latest Ref Range: 79 - 97 fL 92  MCH Latest Ref Range: 26.6 - 33.0 pg 30.2  MCHC Latest Ref Range: 31.5 - 35.7 g/dL 54.4  RDW Latest Ref Range: 11.7 - 15.4 % 11.8  Platelets Latest Ref Range: 150 - 450 x10E3/uL 241  LH Latest Units: mIU/mL 35.1  FSH Latest Units: mIU/mL 9.6  Estradiol Latest Units: pg/mL 48.8  Preg Test, Ur Latest Ref Range: Negative  Negative  Progesterone Latest Units: ng/mL 0.3   Message sent to patient regarding normal lab results and potential next steps regarding fertility  Total time spent in counseling/exam of patient: 24 minutes  Assessment/Plan:     20 y.o. G0 P0 female with amenorrhea/fertility concerns  Labs:  Progesterone/Estradiol FSH/LH CBC Provera 10 day challenge Follow up as needed after labs result     Tresea Mall CNM [facility] 05/04/2020, 12:45 PM

## 2020-07-21 ENCOUNTER — Other Ambulatory Visit: Payer: Self-pay

## 2020-07-21 ENCOUNTER — Ambulatory Visit (INDEPENDENT_AMBULATORY_CARE_PROVIDER_SITE_OTHER): Payer: BC Managed Care – PPO | Admitting: Advanced Practice Midwife

## 2020-07-21 ENCOUNTER — Encounter: Payer: Self-pay | Admitting: Advanced Practice Midwife

## 2020-07-21 VITALS — BP 121/70 | Ht 73.0 in | Wt 218.0 lb

## 2020-07-21 DIAGNOSIS — N912 Amenorrhea, unspecified: Secondary | ICD-10-CM | POA: Diagnosis not present

## 2020-07-21 DIAGNOSIS — N644 Mastodynia: Secondary | ICD-10-CM | POA: Diagnosis not present

## 2020-07-21 NOTE — Patient Instructions (Signed)
Breast Tenderness Breast tenderness is a common problem for women of all ages, but may also occur in men. Breast tenderness may range from mild discomfort to severe pain. In women, the pain usually comes and goes with the menstrual cycle, but it can also be constant. Breast tenderness has many possible causes, including hormone changes, infections, and taking certain medicines. You may have tests, such as a mammogram or an ultrasound, to check for any unusual findings. Having breast tenderness usually does not mean that you have breast cancer. Follow these instructions at home: Managing pain and discomfort  If directed, put ice to the painful area. To do this: Put ice in a plastic bag. Place a towel between your skin and the bag. Leave the ice on for 20 minutes, 2-3 times a day. Wear a supportive bra, especially during exercise. You may also want to wear a supportive bra while sleeping if your breasts are very tender. Medicines Take over-the-counter and prescription medicines only as told by your health care provider. If the cause of your pain is infection, you may be prescribed an antibiotic medicine. If you were prescribed an antibiotic, take it as told by your health care provider. Do not stop taking the antibiotic even if you start to feel better. Eating and drinking Your health care provider may recommend that you lessen the amount of fat in your diet. You can do this by: Limiting fried foods. Cooking foods using methods such as baking, boiling, grilling, and broiling. Decrease the amount of caffeine in your diet. Instead, drink more water and choose caffeine-free drinks. General instructions  Keep a log of the days and times when your breasts are most tender. Ask your health care provider how to do breast exams at home. This will help you notice if you have an unusual growth or lump. Keep all follow-up visits as told by your health care provider. This is important. Contact a health care  provider if: Any part of your breast is hard, red, and hot to the touch. This may be a sign of infection. You are a woman and: Not breastfeeding and you have fluid, especially blood or pus, coming out of your nipples. Have a new or painful lump in your breast that remains after your menstrual period ends. You have a fever. Your pain does not improve or it gets worse. Your pain is interfering with your daily activities. Summary Breast tenderness may range from mild discomfort to severe pain. Breast tenderness has many possible causes, including hormone changes, infections, and taking certain medicines. It can be treated with ice, wearing a supportive bra, and medicines. Make changes to your diet if told to by your health care provider. This information is not intended to replace advice given to you by your health care provider. Make sure you discuss any questions you have with your health care provider. Document Revised: 05/27/2018 Document Reviewed: 05/27/2018 Elsevier Patient Education  2022 [employer] Inc.  

## 2020-07-22 LAB — BETA HCG QUANT (REF LAB): hCG Quant: <1 m[IU]/mL

## 2020-07-23 NOTE — Progress Notes (Signed)
Patient ID: Ruth Werner, female   DOB: [birth year], 20 y.o.   MRN: [id number]  Reason for Consult: Breast Pain    Subjective:  Date of Service: 07/21/2020  HPI:   Ruth Werner is a 20 y.o. female being seen for nipple/breast pain for the past week. She has been actively trying to conceive for the past 6 months. After being seen in April for concerns of amenorrhea and then taking progesterone challenge, she did have one period in May. She did not have a period in June. She did take home pregnancy tests that have been negative. She admits some breast tenderness associated with cycles in the past, however, she has not previously experienced nipple tenderness/pain. She denies concern for breast masses. She did have one episode of nausea recently which she attributes to being overheated. She has not changed her caffeine intake. She agrees to check Beta Hcg quant today and follow up as needed.   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 (Patient not taking: Reported on 07/21/2020)     cyanocobalamin (,VITAMIN B-12,) 1000 MCG/ML injection Inject into the muscle. (Patient not taking: No sig reported)     EPINEPHrine (EPIPEN 2-PAK) 0.3 mg/0.3 mL IJ SOAJ injection Inject 0.3 mLs (0.3 mg total) into the muscle once. (Patient not taking: No sig reported) 1 Device 0   medroxyPROGESTERone (PROVERA) 10 MG tablet Take 1  tablet (10 mg total) by mouth daily. Use for ten days (Patient not taking: Reported on 07/21/2020) 10 tablet 1   promethazine-dextromethorphan (PROMETHAZINE-DM) 6.25-15 MG/5ML syrup Take 5 mLs by mouth 4 (four) times daily as needed. (Patient not taking: No sig reported) 118 mL 0   No current facility-administered medications for this visit.   Review of Systems  Constitutional:  Negative for chills and fever.  HENT:  Negative for congestion, ear discharge, ear pain, hearing loss, sinus pain and sore throat.   Eyes:  Negative for blurred vision and double vision.  Respiratory:  Negative for cough, shortness of breath and wheezing.   Cardiovascular:  Negative for chest pain, palpitations and leg swelling.  Gastrointestinal:  Negative for abdominal pain, blood in stool, constipation, diarrhea, heartburn, melena, nausea and vomiting.  Genitourinary:  Negative for dysuria, flank pain, frequency, hematuria and urgency.  Musculoskeletal:  Negative for back pain, joint pain and myalgias.  Skin:  Negative for itching and rash.  Neurological:  Negative for dizziness, tingling, tremors, sensory change, speech change, focal weakness, seizures, loss of consciousness, weakness and headaches.  Endo/Heme/Allergies:  Negative for environmental allergies. Does not bruise/bleed easily.  Psychiatric/Behavioral:  Negative for depression, hallucinations, memory loss, substance abuse and suicidal ideas. The patient is not nervous/anxious and does not have insomnia.   Breast: positive for breast/nipple pain     Objective:  Objective  Vitals:   07/21/20 1414  BP: 121/70  Weight: 218 lb (98.9 kg)  Height: 6\' 1"  (1.854 m)   Body mass index is 28.76 kg/m. Constitutional: Well nourished, well developed female in no acute distress.  HEENT: normal Skin: Warm and dry.  Breast: no masses, symmetrical, tenderness- esp. nipples- to palpation Cardiovascular: Regular rate and rhythm.   Extremity:  no edema    Respiratory: Clear to auscultation bilateral. Normal respiratory effort Neuro: DTRs 2+, Cranial nerves grossly intact Psych: Alert and Oriented x3. No memory deficits. Normal mood and affect.  MS: normal gait, normal bilateral lower extremity ROM/strength/stability.   Data: Results for Ruth, Werner (MRN [place]) as of 07/23/2020 15:38  Ref. Range 07/21/2020 14:45  hCG Quant Latest Units: mIU/mL <1   Assessment/Plan:     20 y.o. G0 P0 female with breast pain likely associated with hormones  Beta Hcg today Follow up as needed Consider infertility workup as desired after 1 year of active attempts to conceive   21 CNM [facility] 07/23/2020, 3:39 PM

## 2020-07-28 NOTE — Telephone Encounter (Signed)
Erskine Squibb replied to patient thru my chart regarding negative beta result for 7/6. No further labs were needed.

## 2020-09-13 ENCOUNTER — Encounter: Payer: Self-pay | Admitting: Emergency Medicine

## 2020-09-13 ENCOUNTER — Ambulatory Visit
Admission: EM | Admit: 2020-09-13 | Discharge: 2020-09-13 | Disposition: A | Discharge status: Home / Self Care | Payer: No Typology Code available for payment source | Attending: Physician Assistant | Admitting: Physician Assistant

## 2020-09-13 ENCOUNTER — Other Ambulatory Visit: Payer: Self-pay

## 2020-09-13 DIAGNOSIS — R1011 Right upper quadrant pain: Secondary | ICD-10-CM | POA: Diagnosis present

## 2020-09-13 DIAGNOSIS — R112 Nausea with vomiting, unspecified: Secondary | ICD-10-CM | POA: Diagnosis present

## 2020-09-13 DIAGNOSIS — R197 Diarrhea, unspecified: Secondary | ICD-10-CM | POA: Insufficient documentation

## 2020-09-13 LAB — COMPREHENSIVE METABOLIC PANEL
ALT: 15 U/L (ref 0–44)
AST: 26 U/L (ref 15–41)
Albumin: 4.3 g/dL (ref 3.5–5.0)
Alkaline Phosphatase: 57 U/L (ref 38–126)
Anion gap: 6 (ref 5–15)
BUN: 11 mg/dL (ref 6–20)
CO2: 25 mmol/L (ref 22–32)
Calcium: 8.9 mg/dL (ref 8.9–10.3)
Chloride: 107 mmol/L (ref 98–111)
Creatinine, Ser: 0.67 mg/dL (ref 0.44–1.00)
GFR, Estimated: >60 mL/min (ref >60)
Glucose, Bld: 99 mg/dL (ref 70–99)
Potassium: 3.6 mmol/L (ref 3.5–5.1)
Sodium: 138 mmol/L (ref 135–145)
Total Bilirubin: 1.3 mg/dL — ABNORMAL HIGH (ref 0.3–1.2)
Total Protein: 7.2 g/dL (ref 6.5–8.1)

## 2020-09-13 LAB — URINALYSIS, COMPLETE (UACMP) WITH MICROSCOPIC
Bacteria, UA: NONE SEEN
Bilirubin Urine: NEGATIVE
Glucose, UA: NEGATIVE mg/dL
Hgb urine dipstick: NEGATIVE
Ketones, ur: NEGATIVE mg/dL
Leukocytes,Ua: NEGATIVE
Nitrite: NEGATIVE
Protein, ur: NEGATIVE mg/dL
Specific Gravity, Urine: <1.005 — ABNORMAL LOW (ref 1.005–1.030)
pH: 6 (ref 5.0–8.0)

## 2020-09-13 LAB — CBC WITH DIFFERENTIAL/PLATELET
Abs Immature Granulocytes: 0.01 10*3/uL (ref 0.00–0.07)
Basophils Absolute: 0 10*3/uL (ref 0.0–0.1)
Basophils Relative: 1 %
Eosinophils Absolute: 0.1 10*3/uL (ref 0.0–0.5)
Eosinophils Relative: 2 %
HCT: 40.9 % (ref 36.0–46.0)
Hemoglobin: 13.7 g/dL (ref 12.0–15.0)
Immature Granulocytes: 0 %
Lymphocytes Relative: 35 %
Lymphs Abs: 1.9 10*3/uL (ref 0.7–4.0)
MCH: 30.7 pg (ref 26.0–34.0)
MCHC: 33.5 g/dL (ref 30.0–36.0)
MCV: 91.7 fL (ref 80.0–100.0)
Monocytes Absolute: 0.5 10*3/uL (ref 0.1–1.0)
Monocytes Relative: 9 %
Neutro Abs: 3 10*3/uL (ref 1.7–7.7)
Neutrophils Relative %: 53 %
Platelets: 248 10*3/uL (ref 150–400)
RBC: 4.46 MIL/uL (ref 3.87–5.11)
RDW: 12 % (ref 11.5–15.5)
WBC: 5.5 10*3/uL (ref 4.0–10.5)
nRBC: 0 % (ref 0.0–0.2)

## 2020-09-13 LAB — PREGNANCY, URINE: Preg Test, Ur: NEGATIVE

## 2020-09-13 MED ORDER — ONDANSETRON 8 MG PO TBDP: 8.0000 mg | ORAL_TABLET | Freq: Three times a day (TID) | ORAL | 0 refills | Status: DC | PRN

## 2020-09-13 NOTE — Discharge Instructions (Addendum)
-  Blood count negative for signs of infection.  Chemistry with only minimal elevation of the bilirubin but is very close to your last exam. -Diarrhea coloration most likely due to inability to keep foods down.  Abdominal pain at this point nonspecific, possibly related to her vomiting.  No signs of a UTI. -Would recommend pushing fluids as you are able to and bland diet as symptoms allow. -Zofran: 1 tablet under the tongue every 8 hours as needed for nausea -Should abdominal not improve, would follow-up with primary care provider -If abdominal pain becomes acutely worse with present to the ER for evaluation with imaging.

## 2020-09-13 NOTE — ED Provider Notes (Signed)
[facility]    CSN: [id number] Arrival date & time: 09/13/20  1144      History   Chief Complaint Chief Complaint  Patient presents with   Emesis   Diarrhea    HPI Ruth Werner is a 20 y.o. female.   Patient is a 20 year old female who presents with chief complaint of vomiting and diarrhea that began Friday and some right upper quadrant tenderness that began today.  Patient reports her vomiting has been anywhere from brown to black and her diarrhea is now light green in color.  Patient denies any history of ulcers.  Patient states her vomiting was improved a bit yesterday but this morning she ate part of a biscuit on her way to work and then vomited at work today.  She was able to keep fluids down this morning.  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  Patient denies take any daily medications.  She denies any known sick contacts at work.  Patient denies any recent trips or new or different foods.  She reports her right upper quadrant pain feels like somebody is pushing on it.  Rates it a level of 4 out of 10.  Her last menstrual period was July 21.  She has been actively trying to conceive.  She reports negative pregnancy test at home prior to coming in.  She reports she has been taking chewable Pepto-Bismol in the morning and at night last few days   Past Medical History:  Diagnosis Date   Acne    Predisposition to allergic reactions involving upper respiratory tract    to perfumes    Patient Active Problem List   Diagnosis Date Noted   Acute low back pain 04/08/2020    Past Surgical History:  Procedure Laterality Date   APPENDECTOMY     SHOULDER SURGERY     TONSILLECTOMY      OB History     Gravida  0   Para  0   Term  0   Preterm  0   AB  0   Living  0      SAB  0   IAB  0   Ectopic  0   Multiple  0   Live Births  0            Home Medications    Prior to Admission medications   Medication  Sig Start Date End Date Taking? Authorizing Provider  ondansetron (ZOFRAN ODT) 8 MG disintegrating tablet Take 1 tablet (8 mg total) by mouth every 8 (eight) hours as needed for nausea or vomiting. 09/13/20  Yes Luvenia Redden, PA-C    Family History Family History  Problem Relation Age of Onset   Healthy Mother    Healthy Father     Social History Social History   Tobacco Use   Smoking status: Never   Smokeless tobacco: Never  Vaping Use   Vaping Use: Never used  Substance Use Topics   Alcohol use: No   Drug use: No     Allergies   Patient has no known allergies.   Review of Systems Review of Systems as noted above in HPI.  Other systems reviewed and found to be negative.   Physical Exam Triage Vital Signs ED Triage Vitals  Enc Vitals Group     BP 09/13/20 1242 113/67     Pulse Rate 09/13/20 1242 71     Resp 09/13/20 1242 18  Temp 09/13/20 1242 98.2 F (36.8 C)     Temp Source 09/13/20 1242 Oral     SpO2 09/13/20 1242 99 %     Weight 09/13/20 1240 218 lb 0.6 oz (98.9 kg)     Height 09/13/20 1240 6' 1"  (1.854 m)     Head Circumference --      Peak Flow --      Pain Score 09/13/20 1239 5     Pain Loc --      Pain Edu? --      Excl. in [place]? --    No data found.  Updated Vital Signs BP 113/67 (BP Location: Right Arm)   Pulse 71   Temp 98.2 F (36.8 C) (Oral)   Resp 18   Ht 6' 1"  (1.854 m)   Wt 218 lb 0.6 oz (98.9 kg)   LMP 08/05/2020 (Approximate)   SpO2 99%   BMI 28.77 kg/m    Physical Exam Constitutional:      General: She is not in acute distress.    Appearance: Normal appearance. She is normal weight.  HENT:     Head: Normocephalic and atraumatic.  Eyes:     Extraocular Movements: Extraocular movements intact.     Pupils: Pupils are equal, round, and reactive to light.  Cardiovascular:     Rate and Rhythm: Normal rate and regular rhythm.     Pulses: Normal pulses.     Heart sounds: Normal heart sounds.  Pulmonary:     Effort:  Pulmonary effort is normal.     Breath sounds: Normal breath sounds.  Abdominal:     General: Abdomen is flat. Bowel sounds are normal.     Palpations: Abdomen is soft.     Tenderness: There is abdominal tenderness (RUQ tenderness to palpation). There is guarding (RUQ to palpation). There is no right CVA tenderness or left CVA tenderness.  Musculoskeletal:        General: Normal range of motion.  Skin:    General: Skin is warm and dry.     Capillary Refill: Capillary refill takes less than 2 seconds.  Neurological:     General: No focal deficit present.     Mental Status: She is alert and oriented to person, place, and time.     UC Treatments / Results  Labs (all labs ordered are listed, but only abnormal results are displayed) Labs Reviewed  URINALYSIS, COMPLETE (UACMP) WITH MICROSCOPIC - Abnormal; Notable for the following components:      Result Value   Specific Gravity, Urine <1.005 (*)    All other components within normal limits  COMPREHENSIVE METABOLIC PANEL - Abnormal; Notable for the following components:   Total Bilirubin 1.3 (*)    All other components within normal limits  PREGNANCY, URINE  CBC WITH DIFFERENTIAL/PLATELET    EKG   Radiology No results found.  Procedures Procedures (including critical care time)  Medications Ordered in UC Medications - No data to display  Initial Impression / Assessment and Plan / UC Course  I have reviewed the triage vital signs and the nursing notes.  Pertinent labs & imaging results that were available during my care of the patient were reviewed by me and considered in my medical decision making (see chart for details).    Patient presents with vomiting and diarrhea since Friday.  Reports new right upper quadrant pain starting today.  She reports her emesis is anywhere from brown to black in color and that her diarrhea is light  green in color patient denies any history of ulcers.  She has been taking chewable  Pepto-Bismol in the morning and at night.  Last was a period of 7/21.  Right upper quadrant pain tenderness with some guarding.  Pregnancy test negative.  Urinalysis only with some budding yeast.  CBC was normal.  CMP only with minimal elevation total bilirubin of 1.3 (max 1.2).  AST and ALT within normal limits.  Alk phos within normal limits.  Rest of chemistry benign as well.    Most likely gastritis or viral component.  Later onset of her abdominal pain could also be related to pain from vomiting.  Tenderness palpation was quick and with minimal touch.  Green coloration to her diarrhea is most likely due to not we will hold down food.  Dark-colored vomiting could be related to the Pepto-Bismol.  Please she should be safe to discharge with recommendation to push fluids as able.  Brat diet instructions for following with her PCP or go to the ER should her abdominal pain worsen.  Final Clinical Impressions(s) / UC Diagnoses   Final diagnoses:  Nausea vomiting and diarrhea  RUQ pain     Discharge Instructions      -Blood count negative for signs of infection.  Chemistry with only minimal elevation of the bilirubin but is very close to your last exam. -Diarrhea coloration most likely due to inability to keep foods down.  Abdominal pain at this point nonspecific, possibly related to her vomiting.  No signs of a UTI. -Would recommend pushing fluids as you are able to and bland diet as symptoms allow. -Zofran: 1 tablet under the tongue every 8 hours as needed for nausea -Should abdominal not improve, would follow-up with primary care provider -If abdominal pain becomes acutely worse with present to the ER for evaluation with imaging.     ED Prescriptions     Medication Sig Dispense Auth. Provider   ondansetron (ZOFRAN ODT) 8 MG disintegrating tablet Take 1 tablet (8 mg total) by mouth every 8 (eight) hours as needed for nausea or vomiting. 20 tablet Luvenia Redden, PA-C      PDMP not  reviewed this encounter.   Luvenia Redden, PA-C 09/13/20 1424

## 2020-09-13 NOTE — ED Triage Notes (Signed)
Pt c/o diarrhea, vomiting and RUQ abdominal pain. Started about 3 days ago. She states her vomit is black in color and her BM is lime green. Denies fever.

## 2021-02-18 ENCOUNTER — Encounter (HOSPITAL_COMMUNITY): Payer: Self-pay | Admitting: Emergency Medicine

## 2021-02-18 ENCOUNTER — Emergency Department (HOSPITAL_COMMUNITY)
Admission: EM | Admit: 2021-02-18 | Discharge: 2021-02-18 | Disposition: A | Discharge status: Home / Self Care | Payer: PRIVATE HEALTH INSURANCE | Attending: Emergency Medicine | Admitting: Emergency Medicine

## 2021-02-18 ENCOUNTER — Emergency Department (HOSPITAL_COMMUNITY): Payer: PRIVATE HEALTH INSURANCE

## 2021-02-18 DIAGNOSIS — H538 Other visual disturbances: Secondary | ICD-10-CM | POA: Insufficient documentation

## 2021-02-18 DIAGNOSIS — R519 Headache, unspecified: Secondary | ICD-10-CM | POA: Insufficient documentation

## 2021-02-18 LAB — CBC WITH DIFFERENTIAL/PLATELET
Abs Immature Granulocytes: 0.03 10*3/uL (ref 0.00–0.07)
Basophils Absolute: 0 10*3/uL (ref 0.0–0.1)
Basophils Relative: 1 %
Eosinophils Absolute: 0.1 10*3/uL (ref 0.0–0.5)
Eosinophils Relative: 1 %
HCT: 46.3 % — ABNORMAL HIGH (ref 36.0–46.0)
Hemoglobin: 15.1 g/dL — ABNORMAL HIGH (ref 12.0–15.0)
Immature Granulocytes: 1 %
Lymphocytes Relative: 20 %
Lymphs Abs: 1.3 10*3/uL (ref 0.7–4.0)
MCH: 31.3 pg (ref 26.0–34.0)
MCHC: 32.6 g/dL (ref 30.0–36.0)
MCV: 95.9 fL (ref 80.0–100.0)
Monocytes Absolute: 0.6 10*3/uL (ref 0.1–1.0)
Monocytes Relative: 9 %
Neutro Abs: 4.6 10*3/uL (ref 1.7–7.7)
Neutrophils Relative %: 68 %
Platelets: 247 10*3/uL (ref 150–400)
RBC: 4.83 MIL/uL (ref 3.87–5.11)
RDW: 11.7 % (ref 11.5–15.5)
WBC: 6.6 10*3/uL (ref 4.0–10.5)
nRBC: 0 % (ref 0.0–0.2)

## 2021-02-18 LAB — BASIC METABOLIC PANEL
Anion gap: 7 (ref 5–15)
BUN: 5 mg/dL — ABNORMAL LOW (ref 6–20)
CO2: 24 mmol/L (ref 22–32)
Calcium: 9.5 mg/dL (ref 8.9–10.3)
Chloride: 108 mmol/L (ref 98–111)
Creatinine, Ser: 0.8 mg/dL (ref 0.44–1.00)
GFR, Estimated: >60 mL/min (ref >60)
Glucose, Bld: 117 mg/dL — ABNORMAL HIGH (ref 70–99)
Potassium: 4 mmol/L (ref 3.5–5.1)
Sodium: 139 mmol/L (ref 135–145)

## 2021-02-18 LAB — I-STAT BETA HCG BLOOD, ED (MC, WL, AP ONLY): I-stat hCG, quantitative: <5 m[IU]/mL (ref <5)

## 2021-02-18 MED ORDER — PROCHLORPERAZINE EDISYLATE 10 MG/2ML IJ SOLN
10.0000 mg | Freq: Once | INTRAMUSCULAR | Status: AC
  Administered 2021-02-18: 10 mg via INTRAVENOUS
  Filled 2021-02-18: qty 2

## 2021-02-18 MED ORDER — LORAZEPAM 2 MG/ML IJ SOLN: 0.5000 mg | Freq: Once | INTRAMUSCULAR | Status: DC

## 2021-02-18 MED ORDER — DIPHENHYDRAMINE HCL 50 MG/ML IJ SOLN
12.5000 mg | Freq: Once | INTRAMUSCULAR | Status: AC
  Administered 2021-02-18: 12.5 mg via INTRAVENOUS
  Filled 2021-02-18: qty 1

## 2021-02-18 MED ORDER — ONDANSETRON 4 MG PO TBDP
4.0000 mg | ORAL_TABLET | Freq: Once | ORAL | Status: AC
  Administered 2021-02-18: 4 mg via ORAL
  Filled 2021-02-18: qty 1

## 2021-02-18 NOTE — ED Provider Triage Note (Signed)
Emergency Medicine Provider Triage Evaluation Note  Ruth Werner , a 21 y.o. female  was evaluated in triage.  Pt complains of migraine onset yesterday at 4 AM.  She has a history of migraines and was recently diagnosed in January 2023.  She has associated blurred vision, nausea, vomiting, photophobia.  Patient was prescribed as needed amitriptyline however stopped taking it 2 months ago.  Denies chest pain, shortness of breath, abdominal pain, fever, chills, dizziness, lightheadedness.  She notes that she has had multiple migraines within the past couple of weeks that were not resolved with amitriptyline.    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   Review of Systems  Positive: As per HPI above. Negative: Chest pain, shortness of breath  Physical Exam  BP 123/80 (BP Location: Right Arm)    Pulse 86    Temp 98.7 F (37.1 C)    Resp 14    SpO2 98%  Gen:   Awake, no distress   Resp:  Normal effort  MSK:   Moves extremities without difficulty  Other:  Able to ambulate without assistance or difficulty.  No focal neurological deficits on exam.  Negative pronator drift.  Medical Decision Making  Medically screening exam initiated at 12:56 PM.  Appropriate orders placed.  Ruth Arch Mutschler was informed that the remainder of the evaluation will be completed by another provider, this initial triage assessment does not replace that evaluation, and the importance of remaining in the ED until their evaluation is complete.   Dawan Farney A, PA-C 02/18/21 1325

## 2021-02-18 NOTE — Discharge Instructions (Signed)
Make sure to follow-up with neurology as well as your MRI scan  Return for new worsening symptoms or if you change your mind and would like MRI

## 2021-02-18 NOTE — ED Notes (Signed)
RN reviewed discharge instructions w/ pt. Follow up reviewed, pt had no further questions 

## 2021-02-18 NOTE — ED Provider Notes (Signed)
[facility] Provider Note   CSN: [id number] Arrival date & time: 02/18/21  1230    History  Chief Complaint  Patient presents with   Migraine    Ruth Werner is a 21 y.o. female hx of migraines here for evaluation of headache. Began 3 days ago. Previous on PRN amitriptyline . Associated blurred vision (left eye) denies vision fields cuts, diplopia. Blurred vision began when headache began. Improved currently however still has some mild blurring. Some N/V/photophobia bl. No fever, head trauma, neck pain, stiffness, rigidity, numbness, weakness. No eye pain,redness, drainage, pain with movement, facial pain, difficulty with word finding.  No immunocompromise state, chronic medications, no prior headaches prior to January per patient.  Has never followed with neurology.  No family history of autoimmune disorders such as MS.  Took Excedrin at home, amitriptyline without relief.  Recent sick contact   HPI     Home Medications Prior to Admission medications   Medication Sig Start Date End Date Taking? Authorizing Provider  ondansetron (ZOFRAN ODT) 8 MG disintegrating tablet Take 1 tablet (8 mg total) by mouth every 8 (eight) hours as needed for nausea or vomiting. 09/13/20   Luvenia Redden, PA-C      Allergies    Patient has no known allergies.    Review of Systems   Review of Systems  Constitutional: Negative.   HENT: Negative.    Eyes:  Positive for photophobia and visual disturbance.  Respiratory: Negative.    Cardiovascular: Negative.   Gastrointestinal: Negative.   Genitourinary: Negative.   Musculoskeletal: Negative.   Neurological:  Positive for light-headedness and headaches. Negative for dizziness, tremors, seizures, syncope, facial asymmetry, speech difficulty, weakness and numbness.  All other systems reviewed and are negative.  Physical Exam Updated Vital Signs BP 116/74    Pulse 98    Temp 98.7 F (37.1 C)    Resp 15     SpO2 94%  Physical Exam Physical Exam  Constitutional: Pt is oriented to person, place, and time. Pt appears well-developed and well-nourished. No distress.  HENT:  Head: Normocephalic and atraumatic.  Mouth/Throat: Oropharynx is clear and moist.  Eyes: Conjunctivae and EOM are normal. Pupils are equal, round, and reactive to light. No scleral icterus.  No horizontal, vertical or rotational nystagmus  Neck: Normal range of motion. Neck supple.  Full active and passive ROM without pain No midline or paraspinal tenderness No nuchal rigidity or meningeal signs  Cardiovascular: Normal rate, regular rhythm and intact distal pulses.   Pulmonary/Chest: Effort normal and breath sounds normal. No respiratory distress. Pt has no wheezes. No rales.  Abdominal: Soft. Bowel sounds are normal. There is no tenderness. There is no rebound and no guarding.  Musculoskeletal: Normal range of motion.  Lymphadenopathy:    No cervical adenopathy.  Neurological: Pt. is alert and oriented to person, place, and time. He has normal reflexes. No cranial nerve deficit.  Exhibits normal muscle tone. Coordination normal.  Mental Status:  Alert, oriented, thought content appropriate. Speech fluent without evidence of aphasia. Able to follow 2 step commands without difficulty.  Cranial Nerves:  II:  Peripheral visual fields grossly normal, pupils equal, round, reactive to light III,IV, VI: ptosis not present, extra-ocular motions intact bilaterally  V,VII: smile symmetric, facial light touch sensation equal VIII: hearing grossly normal bilaterally  IX,X: midline uvula rise  XI: bilateral shoulder shrug equal and strong XII: midline tongue extension  Motor:  5/5 in upper and lower extremities bilaterally  including strong and equal grip strength and dorsiflexion/plantar flexion Sensory: Pinprick and light touch normal in all extremities.  Deep Tendon Reflexes: 2+ and symmetric  Cerebellar: normal finger-to-nose with  bilateral upper extremities Gait: normal gait and balance CV: distal pulses palpable throughout   Skin: Skin is warm and dry. No rash noted. Pt is not diaphoretic.  Psychiatric: Pt has a normal mood and affect. Behavior is normal. Judgment and thought content normal.  Nursing note and vitals reviewed.  ED Results / Procedures / Treatments   Labs (all labs ordered are listed, but only abnormal results are displayed) Labs Reviewed  BASIC METABOLIC PANEL - Abnormal; Notable for the following components:      Result Value   Glucose, Bld 117 (*)    BUN 5 (*)    All other components within normal limits  CBC WITH DIFFERENTIAL/PLATELET - Abnormal; Notable for the following components:   Hemoglobin 15.1 (*)    HCT 46.3 (*)    All other components within normal limits  I-STAT BETA HCG BLOOD, ED ([facility], [facility], [facility] ONLY)    EKG None  Radiology CT Head Wo Contrast  Result Date: 02/18/2021 CLINICAL DATA:  Provided history: Headache, chronic, new features or increased frequency. Additional history provided: Patient reports migraine for 3 days, blurred vision in left eye. EXAM: CT HEAD WITHOUT CONTRAST TECHNIQUE: Contiguous axial images were obtained from the base of the skull through the vertex without intravenous contrast. RADIATION DOSE REDUCTION: This exam was performed according to the departmental dose-optimization program which includes automated exposure control, adjustment of the mA and/or kV according to patient size and/or use of iterative reconstruction technique. COMPARISON:  Brain MRI 08/09/2011. FINDINGS: Brain: Cerebral volume is normal. There is no acute intracranial hemorrhage. No demarcated cortical infarct. No extra-axial fluid collection. No evidence of an intracranial mass. No midline shift. Vascular: No hyperdense vessel. Skull: Normal. Negative for fracture or focal lesion. Sinuses/Orbits: Visualized orbits show no acute finding. No significant paranasal sinus disease at the imaged  levels. IMPRESSION: Unremarkable non-contrast CT appearance of the brain. No evidence of acute intracranial abnormality. Electronically Signed   By: Kellie Simmering D.O.   On: 02/18/2021 14:08    Procedures Procedures    Medications Ordered in ED Medications  LORazepam (ATIVAN) injection 0.5 mg (0.5 mg Intravenous Patient Refused/Not Given 02/18/21 1644)  ondansetron (ZOFRAN-ODT) disintegrating tablet 4 mg (4 mg Oral Given 02/18/21 1336)  prochlorperazine (COMPAZINE) injection 10 mg (10 mg Intravenous Given 02/18/21 1640)  diphenhydrAMINE (BENADRYL) injection 12.5 mg (12.5 mg Intravenous Given 02/18/21 1639)    ED Course/ Medical Decision Making/ A&P    Pleasant 21 year old here for evaluation of new onset headaches which began earlier on this month.  Typically does not get migraines.  Took her meds without relief.  Yesterday developed some blurring to her left eye without any associated eye pain, visual field cuts, paresthesias, unilateral weakness.  Has had some associate NBNB emesis as well as photophobia.  No neck stiffness, neck rigidity, no fever or meningismus.  While she does admit to some blurring of her vision to her left eye here she otherwise has a nonfocal neuro exam without deficits.  No eye pain to suggest acute angle glaucoma.  No recent traumatic injuries to suggest bleed.  No positional headache or recent spinal procedures to suggest postdural headache, ICH.  No history of IV drug use, recent sick contacts, low suspicion for infectious process.    Labs and imaging personally reviewed and interpreted:  CT  head without significant abnormality CBC without leukocytosis Metabolic panel with mild hyperglycemia 117 Pregnancy test negative  Given age, symptoms emesis early on differential as well as complicated migraine.  Will obtain brain MRI, orbits, cervical, provide migraine cocktail and reassess  Nursing states patient has declined MRI.  I discussed with patient and family at bedside  risk versus benefit of not obtaining MRI including missed demyelination disease such as MS, mass, ischemia, infectious process however patient declines further imaging in ED. She UNDERSTANDS THAT THIS IS AGAINST MEDICAL ADVICE. She has capacity to make medical decisions.  HA has improved with migraine cocktail. Will have her FU outpatient with Neuro given recurrent HA with deficits.  She will return if she changes her mind about further imaging studies or work-up in the emergency department.                           Medical Decision Making Amount and/or Complexity of Data Reviewed External Data Reviewed: notes. Labs: ordered. Decision-making details documented in ED Course. Radiology: ordered and independent interpretation performed. Decision-making details documented in ED Course.  Risk OTC drugs. Prescription drug management. Parenteral controlled substances. Diagnosis or treatment significantly limited by social determinants of health. Risk Details: Risk: Patient declined further imaging studies here in the emergency department         Final Clinical Impression(s) / ED Diagnoses Final diagnoses:  Acute nonintractable headache, unspecified headache type    Rx / DC Orders ED Discharge Orders          Ordered    Ambulatory referral to Neurology       Comments: An appointment is requested in approximately: 2 weeks   02/18/21 1745              Nettie Elm, PA-C 02/18/21 1750    Luna Fuse, MD 02/25/21 [phone]

## 2021-02-18 NOTE — ED Triage Notes (Signed)
Patient here with complaint of migraine, new onset of migraines in January 2023. Patient reports increasing headache since yesterday morning at 0400, blurred vision, nausea and vomiting. Patient states she was prescribed PRN amytriptiline for headaches but stopped taking it two months ago. Patient alert, oriented, and in no apparent distress at this time.

## 2021-03-18 ENCOUNTER — Other Ambulatory Visit: Payer: Self-pay

## 2021-03-18 ENCOUNTER — Ambulatory Visit: Payer: No Typology Code available for payment source | Admitting: Neurology

## 2021-03-18 ENCOUNTER — Encounter: Payer: Self-pay | Admitting: Neurology

## 2021-03-18 VITALS — BP 119/75 | HR 93 | Ht 73.0 in | Wt 218.0 lb

## 2021-03-18 DIAGNOSIS — G43009 Migraine without aura, not intractable, without status migrainosus: Secondary | ICD-10-CM

## 2021-03-18 MED ORDER — ONDANSETRON 4 MG PO TBDP: 4.0000 mg | ORAL_TABLET | Freq: Three times a day (TID) | ORAL | 3 refills | Status: DC | PRN

## 2021-03-18 MED ORDER — TOPIRAMATE 50 MG PO TABS: 50.0000 mg | ORAL_TABLET | Freq: Every day | ORAL | 3 refills | Status: DC

## 2021-03-18 MED ORDER — RIZATRIPTAN BENZOATE 10 MG PO TBDP: 10.0000 mg | ORAL_TABLET | ORAL | 11 refills | Status: DC | PRN

## 2021-03-18 MED ORDER — METHYLPREDNISOLONE 4 MG PO TBPK: ORAL_TABLET | ORAL | 1 refills | Status: DC

## 2021-03-18 NOTE — Patient Instructions (Addendum)
Preventative: Start Topiramate 50mg  and if not tolerated or doesn't work try Ajovy or [employer] once a month fantastic injections. Other option is Lenoria Chime which is the oral daily version of Ajovy/Emgality.  Acute: Triptans are classic medications for acute management. Rizatriptan. Please take one tablet at the onset of your headache. If it does not improve the symptoms please take one additional tablet. Do not take more then 2 tablets in 24hrs. Do not take use more then 2 to 3 times in a week. Other great options are Ubrelvy/Nurtec. Temporarily: Medrol dosepak Ondansetron: Take it with the Rizatriptan  Meds ordered this encounter  Medications   topiramate (TOPAMAX) 50 MG tablet    Sig: Take 1 tablet (50 mg total) by mouth at bedtime. In 1-2 weeks if no side effects increase to 2 tablets at bedtime.    Dispense:  60 tablet    Refill:  3   rizatriptan (MAXALT-MLT) 10 MG disintegrating tablet    Sig: Take 1 tablet (10 mg total) by mouth as needed for migraine. May repeat in 2 hours if needed    Dispense:  9 tablet    Refill:  11   ondansetron (ZOFRAN-ODT) 4 MG disintegrating tablet    Sig: Take 1-2 tablets (4-8 mg total) by mouth every 8 (eight) hours as needed.    Dispense:  30 tablet    Refill:  3   methylPREDNISolone (MEDROL DOSEPAK) 4 MG TBPK tablet    Sig: Take pills daily all together with food. Take the first dose (6 pills) as soon as possible. Take the rest each morning. For 6 days total 6-5-4-3-2-1.    Dispense:  21 tablet    Refill:  1     Methylprednisolone Tablets What is this medication? METHYLPREDNISOLONE (meth ill pred NISS oh lone) treats many conditions such as asthma, allergic reactions, arthritis, inflammatory bowel diseases, adrenal, and blood or bone marrow disorders. It works by decreasing inflammation, slowing down an overactive immune system, or replacing cortisol normally made in the body. Cortisol is a hormone that plays an important role in how the body responds to  stress, illness, and injury. It belongs to a group of medications called steroids. This medicine may be used for other purposes; ask your health care provider or pharmacist if you have questions. COMMON BRAND NAME(S): Medrol, Medrol Dosepak What should I tell my care team before I take this medication? They need to know if you have any of these conditions: Cushing's syndrome Eye disease, vision problems Diabetes Glaucoma Heart disease High blood pressure Infection (especially a virus infection such as chickenpox, cold sores, or herpes) Liver disease Mental illness Myasthenia gravis Osteoporosis Recently received or scheduled to receive a vaccine Seizures Stomach or intestine problems Thyroid disease An unusual or allergic reaction to lactose, methylprednisolone, other medications, foods, dyes, or preservatives Pregnant or trying to get pregnant Breast-feeding How should I use this medication? Take this medication by mouth with a glass of water. Follow the directions on the prescription label. Take this medication with food. If you are taking this medication once a day, take it in the morning. Do not take it more often than directed. Do not suddenly stop taking your medication because you may develop a severe reaction. Your care team will tell you how much medication to take. If your care team wants you to stop the medication, the dose may be slowly lowered over time to avoid any side effects. Talk to your care team about the use of this medication in  children. Special care may be needed. Overdosage: If you think you have taken too much of this medicine contact a poison control center or emergency room at once. NOTE: This medicine is only for you. Do not share this medicine with others. What if I miss a dose? If you miss a dose, take it as soon as you can. If it is almost time for your next dose, talk to your care team. You may need to miss a dose or take an extra dose. Do not take double  or extra doses without advice. What may interact with this medication? Do not take this medication with any of the following: Alefacept Echinacea Live virus vaccines Metyrapone Mifepristone This medication may also interact with the following: Amphotericin B Aspirin and aspirin-like medications Certain antibiotics like erythromycin, clarithromycin, troleandomycin Certain medications for diabetes Certain medications for fungal infections like ketoconazole Certain medications for seizures like carbamazepine, phenobarbital, phenytoin Certain medications that treat or prevent blood clots like warfarin Cholestyramine Cyclosporine Digoxin Diuretics Female hormones, like estrogens and birth control pills Isoniazid NSAIDs, medications for pain inflammation, like ibuprofen or naproxen Other medications for myasthenia gravis Rifampin Vaccines This list may not describe all possible interactions. Give your health care provider a list of all the medicines, herbs, non-prescription drugs, or dietary supplements you use. Also tell them if you smoke, drink alcohol, or use illegal drugs. Some items may interact with your medicine. What should I watch for while using this medication? Tell your care team if your symptoms do not start to get better or if they get worse. Do not stop taking except on your care team's advice. You may develop a severe reaction. Your care team will tell you how much medication to take. This medication may increase your risk of getting an infection. Tell your care team if you are around anyone with measles or chickenpox, or if you develop sores or blisters that do not heal properly. This medication may increase blood sugar levels. Ask your care team if changes in diet or medications are needed if you have diabetes. Tell your care team right away if you have any change in your eyesight. Using this medication for a long time may increase your risk of low bone mass. Talk to your  care team about bone health. What side effects may I notice from receiving this medication? Side effects that you should report to your care team as soon as possible: Allergic reactions--skin rash, itching, hives, swelling of the face, lips, tongue, or throat Cushing syndrome--increased fat around the midsection, upper back, neck, or face, pink or purple stretch marks on the skin, thinning, fragile skin that easily bruises, unexpected hair growth High blood sugar (hyperglycemia)--increased thirst or amount of urine, unusual weakness or fatigue, blurry vision Increase in blood pressure Infection--fever, chills, cough, sore throat, wounds that don't heal, pain or trouble when passing urine, general feeling of discomfort or being unwell Low adrenal gland function--nausea, vomiting, loss of appetite, unusual weakness or fatigue, dizziness Mood and behavior changes--anxiety, nervousness, confusion, hallucinations, irritability, hostility, thoughts of suicide or self-harm, worsening mood, feelings of depression Stomach bleeding--bloody or black, tar-like stools, vomiting blood or brown material that looks like coffee grounds Swelling of the ankles, hands, or feet Side effects that usually do not require medical attention (report to your care team if they continue or are bothersome): Acne General discomfort and fatigue Headache Increase in appetite Nausea Trouble sleeping Weight gain This list may not describe all possible side effects. Call your doctor  for medical advice about side effects. You may report side effects to FDA at 1-800-FDA-1088. Where should I keep my medication? Keep out of the reach of children and pets. Store at room temperature between 20 and 25 degrees C (68 and 77 degrees F). Throw away any unused medication after the expiration date. NOTE: This sheet is a summary. It may not cover all possible information. If you have questions about this medicine, talk to your doctor,  pharmacist, or health care provider.  2022 Elsevier/Gold Standard (2020-03-30 00:00:00)   Rizatriptan Disintegrating Tablets What is this medication? RIZATRIPTAN (rye za TRIP tan) treats migraines. It works by blocking pain signals and narrowing blood vessels in the brain. It belongs to a group of medications called triptans. It is not used to prevent migraines. This medicine may be used for other purposes; ask your health care provider or pharmacist if you have questions. COMMON BRAND NAME(S): Maxalt-MLT What should I tell my care team before I take this medication? They need to know if you have any of these conditions: Cigarette smoker Circulation problems in fingers and toes Diabetes Heart disease High blood pressure High cholesterol History of irregular heartbeat History of stroke Kidney disease Liver disease Stomach or intestine problems An unusual or allergic reaction to rizatriptan, other medications, foods, dyes, or preservatives Pregnant or trying to get pregnant Breast-feeding How should I use this medication? Take this medication by mouth. Follow the directions on the prescription label. Leave the tablet in the sealed blister pack until you are ready to take it. With dry hands, open the blister and gently remove the tablet. If the tablet breaks or crumbles, throw it away and take a new tablet out of the blister pack. Place the tablet in the mouth and allow it to dissolve, and then swallow. Do not cut, crush, or chew this medication. You do not need water to take this medication. Do not take it more often than directed. Talk to your care team regarding the use of this medication in children. While this medication may be prescribed for children as young as 6 years for selected conditions, precautions do apply. Overdosage: If you think you have taken too much of this medicine contact a poison control center or emergency room at once. NOTE: This medicine is only for you. Do not  share this medicine with others. What if I miss a dose? This does not apply. This medication is not for regular use. What may interact with this medication? Do not take this medication with any of the following medications: Certain medications for migraine headache like almotriptan, eletriptan, frovatriptan, naratriptan, rizatriptan, sumatriptan, zolmitriptan Ergot alkaloids like dihydroergotamine, ergonovine, ergotamine, methylergonovine MAOIs like Carbex, Eldepryl, Marplan, Nardil, and Parnate This medication may also interact with the following medications: Certain medications for depression, anxiety, or psychotic disorders Propranolol This list may not describe all possible interactions. Give your health care provider a list of all the medicines, herbs, non-prescription drugs, or dietary supplements you use. Also tell them if you smoke, drink alcohol, or use illegal drugs. Some items may interact with your medicine. What should I watch for while using this medication? Visit your care team for regular checks on your progress. Tell your care team if your symptoms do not start to get better or if they get worse. You may get drowsy or dizzy. Do not drive, use machinery, or do anything that needs mental alertness until you know how this medication affects you. Do not stand up or sit up quickly,  especially if you are an older patient. This reduces the risk of dizzy or fainting spells. Alcohol may interfere with the effect of this medication. Your mouth may get dry. Chewing sugarless gum or sucking hard candy and drinking plenty of water may help. Contact your care team if the problem does not go away or is severe. If you take migraine medications for 10 or more days a month, your migraines may get worse. Keep a diary of headache days and medication use. Contact your care team if your migraine attacks occur more frequently. What side effects may I notice from receiving this medication? Side effects  that you should report to your care team as soon as possible: Allergic reactions--skin rash, itching, hives, swelling of the face, lips, tongue, or throat Burning, pain, tingling, or color changes in the legs or feet Heart attack--pain or tightness in the chest, shoulders, arms, or jaw, nausea, shortness of breath, cold or clammy skin, feeling faint or lightheaded Heart rhythm changes--fast or irregular heartbeat, dizziness, feeling faint or lightheaded, chest pain, trouble breathing Increase in blood pressure Irritability, confusion, fast or irregular heartbeat, muscle stiffness, twitching muscles, sweating, high fever, seizure, chills, vomiting, diarrhea, which may be signs of serotonin syndrome Raynaud's--cool, numb, or painful fingers or toes that may change color from pale, to blue, to red Seizures Stroke--sudden numbness or weakness of the face, arm, or leg, trouble speaking, confusion, trouble walking, loss of balance or coordination, dizziness, severe headache, change in vision Sudden or severe stomach pain, nausea, vomiting, fever, or bloody diarrhea Vision loss Side effects that usually do not require medical attention (report to your care team if they continue or are bothersome): Dizziness General discomfort or fatigue This list may not describe all possible side effects. Call your doctor for medical advice about side effects. You may report side effects to FDA at 1-800-FDA-1088. Where should I keep my medication? Keep out of the reach of children and pets. Store at room temperature between 15 and 30 degrees C (59 and 86 degrees F). Protect from light and moisture. Throw away any unused medication after the expiration date. NOTE: This sheet is a summary. It may not cover all possible information. If you have questions about this medicine, talk to your doctor, pharmacist, or health care provider.  2022 [employer]/Gold Standard (2020-02-11 00:00:00) Topiramate Tablets What is this  medication? TOPIRAMATE (toe PYRE a mate) prevents and controls seizures in people with epilepsy. It may also be used to prevent migraine headaches. It works by calming overactive nerves in your body. This medicine may be used for other purposes; ask your health care provider or pharmacist if you have questions. COMMON BRAND NAME(S): Topamax, Topiragen What should I tell my care team before I take this medication? They need to know if you have any of these conditions: Bleeding disorder Kidney disease Lung disease Suicidal thoughts, plans or attempt An unusual or allergic reaction to topiramate, other medications, foods, dyes, or preservatives Pregnant or trying to get pregnant Breast-feeding How should I use this medication? Take this medication by mouth with water. Take it as directed on the prescription label at the same time every day. Do not cut, crush or chew this medicine. Swallow the tablets whole. You can take it with or without food. If it upsets your stomach, take it with food. Keep taking it unless your care team tells you to stop. A special MedGuide will be given to you by the pharmacist with each prescription and refill. Be sure to  read this information carefully each time. Talk to your care team about the use of this medication in children. While it may be prescribed for children as young as 2 years for selected conditions, precautions do apply. Overdosage: If you think you have taken too much of this medicine contact a poison control center or emergency room at once. NOTE: This medicine is only for you. Do not share this medicine with others. What if I miss a dose? If you miss a dose, take it as soon as you can unless it is within 6 hours of the next dose. If it is within 6 hours of the next dose, skip the missed dose. Take the next dose at the normal time. Do not take double or extra doses. What may interact with this medication? Acetazolamide Alcohol Antihistamines for  allergy, cough, and cold Aspirin and aspirin-like medications Atropine Birth control pills Certain medications for anxiety or sleep Certain medications for bladder problems like oxybutynin, tolterodine Certain medications for depression like amitriptyline, fluoxetine, sertraline Certain medications for Parkinson's disease like benztropine, trihexyphenidyl Certain medications for seizures like carbamazepine, lamotrigine, phenobarbital, phenytoin, primidone, valproic acid, zonisamide Certain medications for stomach problems like dicyclomine, hyoscyamine Certain medications for travel sickness like scopolamine Certain medications that treat or prevent blood clots like warfarin, enoxaparin, dalteparin, apixaban, dabigatran, and rivaroxaban Digoxin Diltiazem General anesthetics like halothane, isoflurane, methoxyflurane, propofol Glyburide Hydrochlorothiazide Ipratropium Lithium Medications that relax muscles Metformin Narcotic medications for pain NSAIDs, medications for pain and inflammation, like ibuprofen or naproxen Phenothiazines like chlorpromazine, mesoridazine, prochlorperazine, thioridazine Pioglitazone This list may not describe all possible interactions. Give your health care provider a list of all the medicines, herbs, non-prescription drugs, or dietary supplements you use. Also tell them if you smoke, drink alcohol, or use illegal drugs. Some items may interact with your medicine. What should I watch for while using this medication? Visit your care team for regular checks on your progress. Tell your care team if your symptoms do not start to get better or if they get worse. Do not suddenly stop taking this medication. You may develop a severe reaction. Your care team will tell you how much medication to take. If your care team wants you to stop the medication, the dose may be slowly lowered over time to avoid any side effects. Wear a medical ID bracelet or chain. Carry a card  that describes your condition. List the medications and doses you take on the card. You may get drowsy or dizzy. Do not drive, use machinery, or do anything that needs mental alertness until you know how this medication affects you. Do not stand up or sit up quickly, especially if you are an older patient. This reduces the risk of dizzy or fainting spells. Alcohol may interfere with the effects of this medication. Avoid alcoholic drinks. This medication may cause serious skin reactions. They can happen weeks to months after starting the medication. Contact your care team right away if you notice fevers or flu-like symptoms with a rash. The rash may be red or purple and then turn into blisters or peeling of the skin. Or, you might notice a red rash with swelling of the face, lips or lymph nodes in your neck or under your arms. Watch for new or worsening thoughts of suicide or depression. This includes sudden changes in mood, behaviors, or thoughts. These changes can happen at any time but are more common in the beginning of treatment or after a change in dose. Call your care  team right away if you experience these thoughts or worsening depression. This medication may slow your child's growth if it is taken for a long time at high doses. Your care team will monitor your child's growth. Using this medication for a long time may weaken your bones. The risk of bone fractures may be increased. Talk to your care team about your bone health. Do not become pregnant while taking this medication. Hormone forms of birth control may not work as well with this medication. Talk to your care team about other forms of birth control. There is potential for serious harm to an unborn child. Tell your care team right away if you think you might be pregnant. What side effects may I notice from receiving this medication? Side effects that you should report to your care team as soon as possible: Allergic reactions--skin rash,  itching, hives, swelling of the face, lips, tongue, or throat High acid level--trouble breathing, unusual weakness or fatigue, confusion, headache, fast or irregular heartbeat, nausea, vomiting High ammonia level--unusual weakness or fatigue, confusion, loss of appetite, nausea, vomiting, seizures Fever that does not go away, decrease in sweat Kidney stones--blood in the urine, pain or trouble passing urine, pain in the lower back or sides Redness, blistering, peeling or loosening of the skin, including inside the mouth Sudden eye pain or change in vision such as blurry vision, seeing halos around lights, vision loss Thoughts of suicide or self-harm, worsening mood, feelings of depression Side effects that usually do not require medical attention (report to your care team if they continue or are bothersome): Burning or tingling sensation in hands or feet Difficulty with paying attention, memory, or speech Dizziness Drowsiness Fatigue Loss of appetite with weight loss Slow or sluggish movements of the body This list may not describe all possible side effects. Call your doctor for medical advice about side effects. You may report side effects to FDA at 1-800-FDA-1088. Where should I keep my medication? Keep out of the reach of children and pets. Store between 15 and 30 degrees C (59 and 86 degrees F). Protect from moisture. Keep the container tightly closed. Get rid of any unused medication after the expiration date. To get rid of medications that are no longer needed or have expired: Take the medication to a medication take-back program. Check with your pharmacy or law enforcement to find a location. If you cannot return the medication, check the label or package insert to see if the medication should be thrown out in the garbage or flushed down the toilet. If you are not sure, ask your care team. If it is safe to put it in the trash, empty the medication out of the container. Mix the medication  with cat litter, dirt, coffee grounds, or other unwanted substance. Seal the mixture in a bag or container. Put it in the trash. NOTE: This sheet is a summary. It may not cover all possible information. If you have questions about this medicine, talk to your doctor, pharmacist, or health care provider.  2022 Elsevier/Gold Standard (2020-03-29 00:00:00)    There is increased risk for stroke in women with migraine with aura and a contraindication for the combined contraceptive pill for use by women who have migraine with aura. The risk for women with migraine without aura is lower. However other risk factors like smoking are far more likely to increase stroke risk than migraine. There is a recommendation for no smoking and for the use of OCPs without estrogen such as progestogen  only pills particularly for women with migraine with aura.Marland Kitchen People who have migraine headaches with auras may be 3 times more likely to have a stroke caused by a blood clot, compared to migraine patients who don't see auras. Women who take hormone-replacement therapy may be 30 percent more likely to suffer a clot-based stroke than women not taking medication containing estrogen. Other risk factors like smoking and high blood pressure may be  much more important.  Recent excellent medications include Ubrelvy and Nurtec, Ajovy and Emgality  Analgesic Rebound Headache An analgesic rebound headache, sometimes called a medication overuse headache or a drug-induced headache, is a secondary disorder that is caused by the overuse of pain medicine (analgesic) to treat the original (primary) headache. Any type of primary headache can return as a rebound headache if a person regularly takes analgesics. The types of primary headaches that are commonly associated with rebound headaches include: Migraines. Headaches that are caused by tense muscles in the head and neck area (tension headaches). Headaches that develop and happen again on one  side of the head and around the eye (cluster headaches). If rebound headaches continue, they can become long-term, daily headaches. What are the causes? This condition may be caused by frequent use of: Over-the-counter medicines such as aspirin, ibuprofen, and acetaminophen. Sinus-relief medicines and medicines that contain caffeine. Narcotic pain medicines such as codeine and oxycodone. Some prescription migraine medicines. What are the signs or symptoms? The symptoms of a rebound headache are the same as the symptoms of the original headache. Some of the symptoms of specific types of headaches include: Migraine headache Pulsing or throbbing pain on one or both sides of the head. Severe pain that interferes with daily activities. Pain that gets worse with physical activity. Nausea, vomiting, or both. Pain and sensitivity with exposure to bright light, loud noises, or strong smells. Visual changes. Numbness of one or both arms. Tension headache Pressure around the head. Dull, aching head pain. Pain felt over the front and sides of the head. Tenderness in the muscles of the head, neck, and shoulders. Cluster headache Severe pain that begins in or around one eye or temple. Droopy or swollen eyelid, or redness and tearing in the eye on the same side as the pain. One-sided head pain. Nausea. Runny nose. Sweaty, pale facial skin. Restlessness. How is this diagnosed? This condition is diagnosed by: Reviewing your medical history. This includes the nature of your primary headaches. Reviewing the types of pain medicines that you have been using to treat your primary headaches and how often you take them. How is this treated? This condition may be treated or managed by: Discontinuing frequent use of the analgesic medicine. Doing this may worsen your headaches at first, but the pain should eventually become more manageable, less frequent, and less severe. Seeing a headache specialist. He  or she may be able to help you manage your headaches and help make sure there is not another cause of the headaches. Using methods of stress relief, such as acupuncture, counseling, biofeedback, and massage. Talk with your health care provider about which methods might be good for you. Follow these instructions at home: Medicines  Take over-the-counter and prescription medicines only as told by your health care provider. Stop the repeated use of pain medicine as told by your health care provider. Stopping can be difficult. Carefully follow instructions from your health care provider. Lifestyle  Follow a regular sleep schedule. Do not vary the time that you go to bed or  the amount that you sleep from day to day. It is important to stay on the same schedule to help prevent headaches. Get 7-9 hours of sleep each night, or the amount recommended by your health care provider. Exercise regularly. Exercise for at least 30 minutes, 5 times each week. Limit or manage stress. Consider stress-relief options such as acupuncture, counseling, biofeedback, and massage. Talk with your health care provider about which methods might be good for you. Do not drink alcohol. Do not use any products that contain nicotine or tobacco, such as cigarettes, e-cigarettes, and chewing tobacco. If you need help quitting, ask your health care provider. General instructions Avoid triggers that are known to cause your primary headaches. Keep all follow-up visits as told by your health care provider. This is important. Contact a health care provider if: You continue to experience headaches after following treatments that your health care provider recommended. Get help right away if you have: New headache pain. Headache pain that is different than what you have experienced in the past. Numbness or tingling in your arms or legs. Changes in your speech or vision. Summary An analgesic rebound headache, sometimes called a  medication overuse headache or a drug-induced headache, is caused by the overuse of pain medicine (analgesic) to treat the original (primary) headache. Any type of primary headache can return as a rebound headache if a person regularly takes analgesics. The types of primary headaches that are commonly associated with rebound headaches include migraines, tension headaches, and cluster headaches. Analgesic rebound headaches can occur with frequent use of over-the-counter medicines and prescription medicines. Treatment involves stopping the medicine that is being overused. This will improve headache frequency and severity. This information is not intended to replace advice given to you by your health care provider. Make sure you discuss any questions you have with your health care provider. Document Revised: 01/30/2019 Document Reviewed: 01/30/2019 Elsevier Patient Education  2022 [employer].

## 2021-03-18 NOTE — Progress Notes (Addendum)
[facility]    Provider:  Dr Lucia Gaskins Requesting Provider: Linwood Dibbles, PA-C Primary Care Provider:  [facility]  CC:  migraines  HPI:  Ruth Werner is a 21 y.o. female here as requested by Henderly, Britni A, PA-C for migraines from the emergency room. I reviewed  Henderly, Britni A, PA-C's notes from 02/18/2021: She was previously on amitriptyline. She presented with a hx of migraines, blurred vision (without diplopia or field cuts), nausea/vomiting/photophobia. No fever, head trauma, neck pain, stiffness or any other red flags for more severe conditions other than migraine. Physical and thorough neurologic exam was normal. Patient declined MRI. HA improved with a migraine cocktail. She was also seen by [facility] and I reviewed those notes in 02/2020 for migraines and that is when she was started on amitriptyline, Dr. Zada Finders, patient reported migraines about 2x a week at that time, pulsatile, radiating down her neck, excedrin and a dark room helps, she had an eye examination prior, stays hydrated, denies any other associated symptoms.   Migraines started last July 2022, mother has migraines. Would start with little headaches and then she got a migraine and it knocked her off of her feet, amitriptyline only helped a little and she had sedative side effects couldn't tolerate increase, headaches. Since then migraines more frequent and mod to severe. Unilateral, pulsating/pounding/throbbbing, light and sound sensitive, nausea, left eye vision blurring, she had MRI at [facility] which was normal, she gets nausea, vomiting, they can be moderate to severe in pain, can last 24 hours, she takes excedrin and zofran, not working. She has 25 headache days a month, 4 severe migraine days a month, 6 moderate for a total of at least 10 migraine days a month that are moderate to severe for > 6 months. No medication overuse. No aura(left eye blurring unclear  if aura but did discuss risk of stroke in women with migraine with aura). No other focal neurologic deficits, associated symptoms, inciting events or modifiable factors.   Reviewed notes, labs and imaging from outside physicians, which showed:  From a thorough chart review, meds tried that can be used in migraine management includes: Elavil, ibuprofen, excedrin, naproxen, zofran, prednisone, compazine, promethazine, BP meds contrainidcated (she is usually 90 systolic today she is nervous and still has lower BP), Sumatriptan    CT showed 02/18/2021: No acute intracranial abnormalities including mass lesion or mass effect, hydrocephalus, extra-axial fluid collection, midline shift, hemorrhage, or acute infarction, large ischemic events (personally reviewed images)  02/18/2021: cbc/cmp unremarkable   Review of Systems: Patient complains of symptoms per HPI as well as the following symptoms headache. Pertinent negatives and positives per HPI. All others negative.   Social History   Socioeconomic History   Marital status: Single    Spouse name: Not on file   Number of children: Not on file   Years of education: Not on file   Highest education level: Not on file  Occupational History   Not on file  Tobacco Use   Smoking status: Never   Smokeless tobacco: Never  Vaping Use   Vaping Use: Never used  Substance and Sexual Activity   Alcohol use: No   Drug use: No   Sexual activity: Yes  Other Topics Concern   Not on file  Social History Narrative   Not on file   Social Determinants of Health   Financial Resource Strain: Not on file  Food Insecurity: Not on file  Transportation Needs: Not  on file  Physical Activity: Not on file  Stress: Not on file  Social Connections: Not on file  Intimate Partner Violence: Not on file    Family History  Problem Relation Age of Onset   Migraines Mother    Healthy Father     Past Medical History:  Diagnosis Date   Acne    Predisposition  to allergic reactions involving upper respiratory tract    to perfumes    Patient Active Problem List   Diagnosis Date Noted   Migraine without aura and without status migrainosus, not intractable 03/20/2021   Acute low back pain 04/08/2020    Past Surgical History:  Procedure Laterality Date   APPENDECTOMY     SHOULDER SURGERY     TONSILLECTOMY      Current Outpatient Medications  Medication Sig Dispense Refill   Aspirin-Acetaminophen-Caffeine (EXCEDRIN MIGRAINE PO) Take by mouth as needed.     methylPREDNISolone (MEDROL DOSEPAK) 4 MG TBPK tablet Take pills daily all together with food. Take the first dose (6 pills) as soon as possible. Take the rest each morning. For 6 days total 6-5-4-3-2-1. 21 tablet 1   ondansetron (ZOFRAN-ODT) 4 MG disintegrating tablet Take 1-2 tablets (4-8 mg total) by mouth every 8 (eight) hours as needed. 30 tablet 3   rizatriptan (MAXALT-MLT) 10 MG disintegrating tablet Take 1 tablet (10 mg total) by mouth as needed for migraine. May repeat in 2 hours if needed 9 tablet 11   topiramate (TOPAMAX) 50 MG tablet Take 1 tablet (50 mg total) by mouth at bedtime. In 1-2 weeks if no side effects increase to 2 tablets at bedtime. 60 tablet 3   No current facility-administered medications for this visit.    Allergies as of 03/18/2021   (No Known Allergies)    Vitals: BP 119/75    Pulse 93    Ht 6\' 1"  (1.854 m)    Wt 218 lb (98.9 kg)    BMI 28.76 kg/m  Last Weight:  Wt Readings from Last 1 Encounters:  03/18/21 218 lb (98.9 kg)   Last Height:   Ht Readings from Last 1 Encounters:  03/18/21 6\' 1"  (1.854 m)     Physical exam: Exam: Gen: NAD, conversant, well nourised,  well groomed                     CV: RRR, no MRG. No Carotid Bruits. No peripheral edema, warm, nontender Eyes: Conjunctivae clear without exudates or hemorrhage  Neuro: Detailed Neurologic Exam  Speech:    Speech is normal; fluent and spontaneous with normal comprehension.   Cognition:    The patient is oriented to person, place, and time;     recent and remote memory intact;     language fluent;     normal attention, concentration,     fund of knowledge Cranial Nerves:    The pupils are equal, round, and reactive to light. The fundi are flat. Visual fields are full to finger confrontation. Extraocular movements are intact. Trigeminal sensation is intact and the muscles of mastication are normal. The face is symmetric. The palate elevates in the midline. Hearing intact. Voice is normal. Shoulder shrug is normal. The tongue has normal motion without fasciculations.   Coordination:    Normal  Gait: normal.   Motor Observation:    No asymmetry, no atrophy, and no involuntary movements noted. Tone:    Normal muscle tone.    Posture:    Posture is normal. normal erect  Strength:    Strength is V/V in the upper and lower limbs.      Sensation: intact to LT     Reflex Exam:  DTR's:    Deep tendon reflexes in the upper and lower extremities are normal bilaterally.   Toes:    The toes are downgoing bilaterally.   Clonus:    Clonus is absent.    Assessment/Plan:  This is a patient with migraine without aura.  We had a long talk today about migraines and migraine management, I advised the patient to educate herself on migraines, I gave her literature to read, a new patient migraine packet, we discussed what migraines are, triggers, lifestyle factors, migraine management as far as acute and preventative.  She will return in 8 weeks.  Preventative: Start Topiramate 50mg  and if not tolerated or doesn't work try Ajovy or [employer] once a month fantastic injections. Other option is Bennie PieriniQulipta which is the oral daily version of Ajovy/Emgality.  Acute: Triptans are classic medications for acute management. Rizatriptan. Please take one tablet at the onset of your headache. If it does not improve the symptoms please take one additional tablet. Do not take more then 2  tablets in 24hrs. Do not take use more then 2 to 3 times in a week. Other great options are Ubrelvy/Nurtec. Temporarily: Medrol dosepak Ondansetron: Take it with the Rizatriptan  Orders Placed This Encounter  Procedures   TSH   Meds ordered this encounter  Medications   topiramate (TOPAMAX) 50 MG tablet    Sig: Take 1 tablet (50 mg total) by mouth at bedtime. In 1-2 weeks if no side effects increase to 2 tablets at bedtime.    Dispense:  60 tablet    Refill:  3   rizatriptan (MAXALT-MLT) 10 MG disintegrating tablet    Sig: Take 1 tablet (10 mg total) by mouth as needed for migraine. May repeat in 2 hours if needed    Dispense:  9 tablet    Refill:  11   ondansetron (ZOFRAN-ODT) 4 MG disintegrating tablet    Sig: Take 1-2 tablets (4-8 mg total) by mouth every 8 (eight) hours as needed.    Dispense:  30 tablet    Refill:  3   methylPREDNISolone (MEDROL DOSEPAK) 4 MG TBPK tablet    Sig: Take pills daily all together with food. Take the first dose (6 pills) as soon as possible. Take the rest each morning. For 6 days total 6-5-4-3-2-1.    Dispense:  21 tablet    Refill:  1   Discussed: To prevent or relieve headaches, try the following: Cool Compress. Lie down and place a cool compress on your head.  Avoid headache triggers. If certain foods or odors seem to have triggered your migraines in the past, avoid them. A headache diary might help you identify triggers.  Include physical activity in your daily routine. Try a daily walk or other moderate aerobic exercise.  Manage stress. Find healthy ways to cope with the stressors, such as delegating tasks on your to-do list.  Practice relaxation techniques. Try deep breathing, yoga, massage and visualization.  Eat regularly. Eating regularly scheduled meals and maintaining a healthy diet might help prevent headaches. Also, drink plenty of fluids.  Follow a regular sleep schedule. Sleep deprivation might contribute to headaches Consider  biofeedback. With this mind-body technique, you learn to control certain bodily functions -- such as muscle tension, heart rate and blood pressure -- to prevent headaches or reduce headache pain.  Proceed to emergency room if you experience new or worsening symptoms or symptoms do not resolve, if you have new neurologic symptoms or if headache is severe, or for any concerning symptom.   Provided education and documentation from American headache Society toolbox including articles on: chronic migraine medication overuse headache, chronic migraines, prevention of migraines, behavioral and other nonpharmacologic treatments for headache.  Cc: Henderly, Britni Astrid Drafts,  [place], [facility]  Naomie Dean, MD  [facility] [address] [place], [place] [address]  Phone [phone] Fax [phone]

## 2021-03-20 ENCOUNTER — Encounter: Payer: Self-pay | Admitting: Neurology

## 2021-03-20 DIAGNOSIS — G43009 Migraine without aura, not intractable, without status migrainosus: Secondary | ICD-10-CM | POA: Insufficient documentation

## 2021-03-20 HISTORY — DX: Migraine without aura, not intractable, without status migrainosus: G43.009

## 2021-03-20 NOTE — Addendum Note (Signed)
Addended by: Naomie Dean B on: 03/20/2021 12:23 PM ? ? Modules accepted: Orders ? ?

## 2021-05-16 ENCOUNTER — Encounter: Payer: Self-pay | Admitting: Neurology

## 2021-05-16 ENCOUNTER — Telehealth (INDEPENDENT_AMBULATORY_CARE_PROVIDER_SITE_OTHER): Payer: Self-pay | Admitting: Neurology

## 2021-05-16 DIAGNOSIS — Z91199 Patient's noncompliance with other medical treatment and regimen due to unspecified reason: Secondary | ICD-10-CM

## 2021-05-16 NOTE — Progress Notes (Signed)
   Complete physical exam  Patient: Ruth Werner   DOB: 11/05/2000   21 y.o. Female  MRN: [id number]  Subjective:    No chief complaint on file.   Ruth Werner is a 21 y.o. female who presents today for a complete physical exam. She reports consuming a {diet types:17450} diet. {types:19826} She generally feels {DESC; WELL/FAIRLY WELL/POORLY:18703}. She reports sleeping {DESC; WELL/FAIRLY WELL/POORLY:18703}. She {does/does not:200015} have additional problems to discuss today.    Most recent fall risk assessment:    07/13/2021   10:42 AM  Fall Risk   Falls in the past year? 0  Number falls in past yr: 0  Injury with Fall? 0  Risk for fall due to : No Fall Risks  Follow up Falls evaluation completed     Most recent depression screenings:    07/13/2021   10:42 AM 06/03/2020   10:46 AM  PHQ 2/9 Scores  PHQ - 2 Score 0 0  PHQ- 9 Score 5     {VISON DENTAL STD PSA (Optional):27386}  {History (Optional):23778}  Patient Care Team: Jessup, Joy, NP as PCP - General (Nurse Practitioner)   Outpatient Medications Prior to Visit  Medication Sig   fluticasone (FLONASE) 50 MCG/ACT nasal spray Place 2 sprays into both nostrils in the morning and at bedtime. After 7 days, reduce to once daily.   norgestimate-ethinyl estradiol (SPRINTEC 28) 0.25-35 MG-MCG tablet Take 1 tablet by mouth daily.   Nystatin POWD Apply liberally to affected area 2 times per day   spironolactone (ALDACTONE) 100 MG tablet Take 1 tablet (100 mg total) by mouth daily.   No facility-administered medications prior to visit.    ROS        Objective:     There were no vitals taken for this visit. {Vitals History (Optional):23777}  Physical Exam   No results found for any visits on 08/18/21. {Show previous labs (optional):23779}    Assessment & Plan:    Routine Health Maintenance and Physical Exam  Immunization History  Administered Date(s) Administered   DTaP 01/19/1999, 03/17/1999,  05/26/1999, 02/09/2000, 08/25/2003   Hepatitis A 06/21/2007, 06/26/2008   Hepatitis B 11/06/1998, 12/14/1998, 05/26/1999   HiB (PRP-OMP) 01/19/1999, 03/17/1999, 05/26/1999, 02/09/2000   IPV 01/19/1999, 03/17/1999, 11/14/1999, 08/25/2003   Influenza,inj,Quad PF,6+ Mos 09/26/2013   Influenza-Unspecified 12/27/2011   MMR 11/13/2000, 08/25/2003   Meningococcal Polysaccharide 06/26/2011   Pneumococcal Conjugate-13 02/09/2000   Pneumococcal-Unspecified 05/26/1999, 08/09/1999   Tdap 06/26/2011   Varicella 11/14/1999, 06/21/2007    Health Maintenance  Topic Date Due   HIV Screening  Never done   Hepatitis C Screening  Never done   INFLUENZA VACCINE  08/16/2021   PAP-Cervical Cytology Screening  08/18/2021 (Originally 11/05/2019)   PAP SMEAR-Modifier  08/18/2021 (Originally 11/05/2019)   TETANUS/TDAP  08/18/2021 (Originally 06/25/2021)   HPV VACCINES  Discontinued   COVID-19 Vaccine  Discontinued    Discussed health benefits of physical activity, and encouraged her to engage in regular exercise appropriate for her age and condition.  Problem List Items Addressed This Visit   None Visit Diagnoses     Annual physical exam    -  Primary   Cervical cancer screening       Need for Tdap vaccination          No follow-ups on file.     Joy Jessup, NP   

## 2021-06-26 ENCOUNTER — Ambulatory Visit: Admit: 2021-06-26 | Discharge status: Absent without leave | Payer: PRIVATE HEALTH INSURANCE

## 2021-08-07 ENCOUNTER — Inpatient Hospital Stay (HOSPITAL_COMMUNITY)
Admission: AD | Admit: 2021-08-07 | Discharge: 2021-08-07 | Disposition: A | Discharge status: Home / Self Care | Payer: PRIVATE HEALTH INSURANCE | Attending: Obstetrics and Gynecology | Admitting: Obstetrics and Gynecology

## 2021-08-07 ENCOUNTER — Encounter (HOSPITAL_COMMUNITY): Payer: Self-pay

## 2021-08-07 ENCOUNTER — Inpatient Hospital Stay (HOSPITAL_COMMUNITY): Payer: PRIVATE HEALTH INSURANCE

## 2021-08-07 ENCOUNTER — Other Ambulatory Visit: Payer: Self-pay

## 2021-08-07 DIAGNOSIS — N939 Abnormal uterine and vaginal bleeding, unspecified: Secondary | ICD-10-CM

## 2021-08-07 DIAGNOSIS — Z3A01 Less than 8 weeks gestation of pregnancy: Secondary | ICD-10-CM | POA: Insufficient documentation

## 2021-08-07 DIAGNOSIS — O209 Hemorrhage in early pregnancy, unspecified: Secondary | ICD-10-CM | POA: Insufficient documentation

## 2021-08-07 DIAGNOSIS — K589 Irritable bowel syndrome without diarrhea: Secondary | ICD-10-CM

## 2021-08-07 DIAGNOSIS — O99611 Diseases of the digestive system complicating pregnancy, first trimester: Secondary | ICD-10-CM | POA: Diagnosis not present

## 2021-08-07 DIAGNOSIS — O3680X0 Pregnancy with inconclusive fetal viability, not applicable or unspecified: Secondary | ICD-10-CM | POA: Diagnosis not present

## 2021-08-07 HISTORY — DX: Irritable bowel syndrome, unspecified: K58.9

## 2021-08-07 LAB — CBC
HCT: 43.2 % (ref 36.0–46.0)
Hemoglobin: 14.4 g/dL (ref 12.0–15.0)
MCH: 30.8 pg (ref 26.0–34.0)
MCHC: 33.3 g/dL (ref 30.0–36.0)
MCV: 92.3 fL (ref 80.0–100.0)
Platelets: 262 10*3/uL (ref 150–400)
RBC: 4.68 MIL/uL (ref 3.87–5.11)
RDW: 11.9 % (ref 11.5–15.5)
WBC: 5.8 10*3/uL (ref 4.0–10.5)
nRBC: 0 % (ref 0.0–0.2)

## 2021-08-07 LAB — ABO/RH: ABO/RH(D): A POS

## 2021-08-07 LAB — URINALYSIS, ROUTINE W REFLEX MICROSCOPIC
Bilirubin Urine: NEGATIVE
Glucose, UA: NEGATIVE mg/dL
Ketones, ur: NEGATIVE mg/dL
Leukocytes,Ua: NEGATIVE
Nitrite: NEGATIVE
Protein, ur: NEGATIVE mg/dL
Specific Gravity, Urine: 1.017 (ref 1.005–1.030)
pH: 5 (ref 5.0–8.0)

## 2021-08-07 LAB — HCG, QUANTITATIVE, PREGNANCY: hCG, Beta Chain, Quant, S: 210 m[IU]/mL — ABNORMAL HIGH (ref <5)

## 2021-08-07 LAB — POCT PREGNANCY, URINE: Preg Test, Ur: POSITIVE — AB

## 2021-08-07 NOTE — MAU Note (Signed)
Ruth Werner is a 21 y.o. at [facility] here in MAU reporting: found out she was pregnant on Monday. Was seen by OB and on Tuesday HCG was 93.6 and on Friday HCG was 138. Has been having some bleeding but states less than a period.  Pt able to pull up message in her patient portal in regards to labs.  LMP: 06/28/21  Onset of complaint: ongoing  Pain score: 0/10  Vitals:   08/07/21 0926  BP: 107/66  Pulse: 98  Resp: 16  Temp: 98.1 F (36.7 C)  SpO2: 98%     Lab orders placed from triage: ua

## 2021-08-07 NOTE — Discharge Instructions (Signed)
Return to care  If you have heavier bleeding that soaks through more than 2 pads per hour for an hour or more If you bleed so much that you feel like you might pass out or you do pass out If you have significant abdominal pain that is not improved with Tylenol   

## 2021-08-07 NOTE — MAU Provider Note (Addendum)
History     [id number]  Arrival date and time: 08/07/21 0905    Chief Complaint  Patient presents with   Possible Pregnancy   Vaginal Bleeding     HPI Ruth Werner is a 21 y.o. at [redacted]w[redacted]d who presents for vaginal bleeding. Reports daily vaginal spotting since last Sunday. Had a positive pregnancy test on Monday. Has been seen at [facility] twice this past week - HCG went from 93 to 138 (on Friday). Has not been bleeding into pads. This weekend has seen some brown spotting on toilet paper. Denies abdominal pain, vaginal discharge, vaginal irritation, dysuria. Last intercourse was over a week ago.    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   Current Outpatient Medications on File Prior to Encounter  Medication Sig Dispense Refill   letrozole (FEMARA) 2.5 MG tablet Take by mouth.     rizatriptan (MAXALT-MLT) 10 MG disintegrating tablet Take 1 tablet (10 mg total) by mouth as needed for migraine. May repeat in 2 hours if needed 9 tablet 11     ROS Pertinent positives and negative per HPI, all others reviewed and negative  Physical Exam   BP 106/65   Pulse 88   Temp 98.1 F (36.7 C) (Oral)   Resp 16   Ht 6\' 1"  (1.854 m)   Wt 99.8 kg   LMP 06/28/2021    SpO2 98%   BMI 29.04 kg/m   Patient Vitals for the past 24 hrs:  BP Temp Temp src Pulse Resp SpO2 Height Weight  08/07/21 1120 106/65 -- -- 88 -- -- -- --  08/07/21 0927 -- -- -- -- -- -- 6\' 1"  (1.854 m) 99.8 kg  08/07/21 0926 107/66 98.1 F (36.7 C) Oral 98 16 98 % -- --    Physical Exam Vitals and nursing note reviewed.  Constitutional:      General: She is not in acute distress.    Appearance: Normal appearance.  HENT:     Head: Normocephalic and atraumatic.  Eyes:     General: No scleral icterus.    Conjunctiva/sclera: Conjunctivae normal.  Pulmonary:     Effort: Pulmonary effort is normal. No respiratory distress.  Abdominal:     General: Abdomen is flat.     Palpations: Abdomen is soft.     Tenderness: There is no abdominal tenderness. There is no guarding or rebound.  Skin:    General: Skin is warm and dry.  Neurological:     Mental Status: She is alert.  Psychiatric:        Mood and Affect: Mood normal.        Behavior: Behavior normal.        Labs Results for orders placed or performed during the hospital encounter of 08/07/21 (from the past 24 hour(s))  Urinalysis, Routine w reflex microscopic Urine, Clean Catch     Status: Abnormal   Collection Time: 08/07/21  9:26 AM  Result Value Ref Range   Color, Urine YELLOW YELLOW   APPearance HAZY (A) CLEAR   Specific Gravity, Urine 1.017 1.005 - 1.030   pH 5.0 5.0 - 8.0   Glucose, UA NEGATIVE NEGATIVE mg/dL   Hgb urine dipstick MODERATE (A) NEGATIVE   Bilirubin Urine NEGATIVE NEGATIVE   Ketones, ur NEGATIVE NEGATIVE mg/dL   Protein, ur NEGATIVE NEGATIVE mg/dL   Nitrite NEGATIVE NEGATIVE   Leukocytes,Ua NEGATIVE NEGATIVE   RBC / HPF 0-5 0 - 5 RBC/hpf   WBC, UA 0-5 0 - 5 WBC/hpf   Bacteria, UA RARE (A) NONE SEEN   Squamous Epithelial / LPF 0-5 0 - 5   Mucus PRESENT   Pregnancy, urine POC     Status: Abnormal   Collection Time: 08/07/21  9:45 AM  Result Value Ref Range   Preg Test, Ur POSITIVE (A)  NEGATIVE  CBC     Status: None   Collection Time: 08/07/21  9:54 AM  Result Value Ref Range   WBC 5.8 4.0 - 10.5 K/uL   RBC 4.68 3.87 - 5.11 MIL/uL   Hemoglobin 14.4 12.0 - 15.0 g/dL   HCT 66.0 63.0 - 16.0 %   MCV 92.3 80.0 - 100.0 fL   MCH 30.8 26.0 - 34.0 pg   MCHC 33.3 30.0 - 36.0 g/dL   RDW 10.9 32.3 - 55.7 %   Platelets 262 150 - 400 K/uL   nRBC 0.0 0.0 - 0.2 %  ABO/Rh     Status: None   Collection Time: 08/07/21  9:54 AM  Result Value Ref Range   ABO/RH(D) A POS    No rh immune globuloin      NOT A RH IMMUNE GLOBULIN CANDIDATE, PT RH POSITIVE Performed at [facility], [address], [place], [place] [address]   hCG, quantitative, pregnancy     Status: Abnormal   Collection Time: 08/07/21  9:54 AM  Result Value Ref Range   hCG, Beta Chain, Quant, S 210 (H) <5 mIU/mL    Imaging US OB LESS THAN 14 WEEKS WITH OB TRANSVAGINAL  Result Date: 08/07/2021 CLINICAL DATA:  Vaginal bleeding. Previous quantitative beta HCG 93.6. No current quantitative HCG. Estimated gestational age per LMP 5 weeks 5 days. EXAM: OBSTETRIC <14 WK [place] AND TRANSVAGINAL OB US TECHNIQUE: Both transabdominal and transvaginal ultrasound examinations were performed for complete evaluation of the gestation as well as the maternal uterus, adnexal regions, and pelvic cul-de-sac. Transvaginal technique was performed to assess early pregnancy. COMPARISON:  None Available. FINDINGS: Intrauterine gestational sac: Not visualized. Yolk sac:  Not visualized. Embryo:  Not visualized. Cardiac Activity: Not visualized. Heart Rate: Not applicable. MSD: Not visualized. CRL: Not visualized. Subchorionic hemorrhage:  Not applicable. Maternal uterus/adnexae: Uterus is normal size, shape and position. Endometrium is within normal measuring 7 mm. Ovaries normal size, shape and position. No significant free fluid. IMPRESSION: No intrauterine gestational sac, yolk sac, fetal pole, or cardiac activity visualized. Differential  considerations include intrauterine gestation too early to be  sonographically visualized, spontaneous abortion, or less likely ectopic pregnancy. Consider follow-up ultrasound in 14 days and serial quantitative beta HCG follow-up. Electronically Signed   By: Elberta Fortis M.D.   On: 08/07/2021 10:25    MAU Course  Procedures Lab Orders         Urinalysis, Routine w reflex microscopic Urine, Clean Catch         CBC         hCG, quantitative, pregnancy         Pregnancy, urine POC    No orders of the defined types were placed in this encounter.  Imaging Orders         US OB LESS THAN 14 WEEKS WITH OB TRANSVAGINAL      MDM Previous HCG results reviewed on patient's phone.  Labs & imaging ordered - results reviewed.   HCG today is 210 & nothing seen on ultrasound. HCG has risen 52% since Friday. Patient has no pain & this is a much desired pregnancy. She has follow up scheduled with her OB. RH positive   Discussed with patient the diagnosis of pregnancy of unknown anatomic location.  Three possibilities of outcome are: a healthy pregnancy that is too early to see on ultrasound, a pregnancy that is not healthy and has not developed and will not develop, and an ectopic pregnancy that cannot be identified at this time. She is aware that she needs to follow-up at [facility] on Tuesday for a repeat beta-hCG level to determine next steps. All questions were answered, MAU precautions discussed.  Assessment and Plan   1. Pregnancy of unknown anatomic location   2. Vaginal spotting   3. [redacted] weeks gestation of pregnancy    -Patient previously told by her provider to call office tomorrow for f/u. Encouraged to call them in the morning for follow up labs -Reviewed bleeding precautions - continue pelvic rest -Reviewed s/s of ectopic & reasons to return to MAU  Judeth Horn, NP 08/07/21 11:29 AM

## 2021-08-15 ENCOUNTER — Inpatient Hospital Stay (HOSPITAL_COMMUNITY)
Admission: AD | Admit: 2021-08-15 | Discharge: 2021-08-15 | Disposition: A | Discharge status: Home / Self Care | Payer: No Typology Code available for payment source | Attending: Obstetrics and Gynecology | Admitting: Obstetrics and Gynecology

## 2021-08-15 ENCOUNTER — Other Ambulatory Visit: Payer: Self-pay | Admitting: Obstetrics and Gynecology

## 2021-08-15 ENCOUNTER — Other Ambulatory Visit: Payer: Self-pay

## 2021-08-15 DIAGNOSIS — O009 Unspecified ectopic pregnancy without intrauterine pregnancy: Secondary | ICD-10-CM | POA: Insufficient documentation

## 2021-08-15 LAB — COMPREHENSIVE METABOLIC PANEL
ALT: 17 U/L (ref 0–44)
AST: 21 U/L (ref 15–41)
Albumin: 4.2 g/dL (ref 3.5–5.0)
Alkaline Phosphatase: 35 U/L — ABNORMAL LOW (ref 38–126)
Anion gap: 6 (ref 5–15)
BUN: 9 mg/dL (ref 6–20)
CO2: 27 mmol/L (ref 22–32)
Calcium: 8.9 mg/dL (ref 8.9–10.3)
Chloride: 107 mmol/L (ref 98–111)
Creatinine, Ser: 0.88 mg/dL (ref 0.44–1.00)
GFR, Estimated: >60 mL/min (ref >60)
Glucose, Bld: 100 mg/dL — ABNORMAL HIGH (ref 70–99)
Potassium: 4.1 mmol/L (ref 3.5–5.1)
Sodium: 140 mmol/L (ref 135–145)
Total Bilirubin: 0.8 mg/dL (ref 0.3–1.2)
Total Protein: 6.7 g/dL (ref 6.5–8.1)

## 2021-08-15 LAB — CBC WITH DIFFERENTIAL/PLATELET
Abs Immature Granulocytes: 0.02 10*3/uL (ref 0.00–0.07)
Basophils Absolute: 0 10*3/uL (ref 0.0–0.1)
Basophils Relative: 0 %
Eosinophils Absolute: 0.1 10*3/uL (ref 0.0–0.5)
Eosinophils Relative: 1 %
HCT: 39.4 % (ref 36.0–46.0)
Hemoglobin: 13.1 g/dL (ref 12.0–15.0)
Immature Granulocytes: 0 %
Lymphocytes Relative: 26 %
Lymphs Abs: 1.9 10*3/uL (ref 0.7–4.0)
MCH: 30.9 pg (ref 26.0–34.0)
MCHC: 33.2 g/dL (ref 30.0–36.0)
MCV: 92.9 fL (ref 80.0–100.0)
Monocytes Absolute: 0.6 10*3/uL (ref 0.1–1.0)
Monocytes Relative: 8 %
Neutro Abs: 4.7 10*3/uL (ref 1.7–7.7)
Neutrophils Relative %: 65 %
Platelets: 252 10*3/uL (ref 150–400)
RBC: 4.24 MIL/uL (ref 3.87–5.11)
RDW: 12 % (ref 11.5–15.5)
WBC: 7.4 10*3/uL (ref 4.0–10.5)
nRBC: 0 % (ref 0.0–0.2)

## 2021-08-15 LAB — HCG, QUANTITATIVE, PREGNANCY: hCG, Beta Chain, Quant, S: 816 m[IU]/mL — ABNORMAL HIGH (ref <5)

## 2021-08-15 MED ORDER — METHOTREXATE FOR ECTOPIC PREGNANCY
50.0000 mg/m2 | Freq: Once | INTRAMUSCULAR | Status: AC
  Administered 2021-08-15: 112.5 mg via INTRAMUSCULAR
  Filled 2021-08-15: qty 10

## 2021-08-15 NOTE — MAU Provider Note (Signed)
Here for MTX therapy for ectopic pregnancy. Private MD is managing care.     [name] is a 21 y.o. G1P0000 patient who presents to MAU today for MTX therapy for ectopic pregnancy. She is having right sided hip pain and some intermittent spotting.    O BP 103/67 (BP Location: Right Arm)   Pulse 93   Temp 98.2 F (36.8 C) (Oral)   Resp 16   Ht 6\' 1"  (1.854 m)   Wt 101.7 kg   LMP 06/28/2021   SpO2 98% Comment: room air'  BMI 29.59 kg/m  Physical Exam Vitals and nursing note reviewed.  Constitutional:      General: She is not in acute distress. HENT:     Head: Normocephalic.     Mouth/Throat:     Mouth: Mucous membranes are moist.  Eyes:     Pupils: Pupils are equal, round, and reactive to light.  Cardiovascular:     Rate and Rhythm: Normal rate.  Pulmonary:     Effort: Pulmonary effort is normal.  Neurological:     Mental Status: She is alert.  Psychiatric:        Mood and Affect: Mood normal.        Behavior: Behavior normal.     A Medical screening exam complete Ectopic pregnancy Methotrexate therapy  P Discharge from MAU in stable condition Ectopic precautions reviewed  Patient has FU appt in the office on 08/18/2021 and 08/22/2021  Patient may return to MAU as needed   10/22/2021 DNP, CNM  08/15/21  5:39 PM

## 2021-08-15 NOTE — MAU Note (Signed)
Discussed labs with Dr Amado Nash. Molli Knock to proceed with MTX. Has follow up planned in the office. MD will put d/c order in.

## 2021-08-15 NOTE — MAU Note (Addendum)
Ruth Werner is a 21 y.o. at [redacted]w[redacted]d here in MAU reporting: was advised to come over from MTX for suspected ectopic pregnancy. Having right hip pain. Still some light bleeding when she wipes.   Onset of complaint: ongoing  Pain score: 2/10  Vitals:   08/15/21 1521  BP: 103/67  Pulse: 93  Resp: 16  Temp: 98.2 F (36.8 C)  SpO2: 98%     Lab orders placed from triage: hcg, cbc, cmp  D/w Dr Amado Nash, verbal orders for labs received, RN to call back once results are back

## 2021-08-23 ENCOUNTER — Inpatient Hospital Stay (HOSPITAL_COMMUNITY)
Admission: AD | Admit: 2021-08-23 | Discharge: 2021-08-23 | Disposition: A | Discharge status: Home / Self Care | Payer: BC Managed Care – PPO | Attending: Obstetrics | Admitting: Obstetrics

## 2021-08-23 DIAGNOSIS — R110 Nausea: Secondary | ICD-10-CM | POA: Insufficient documentation

## 2021-08-23 DIAGNOSIS — O00101 Right tubal pregnancy without intrauterine pregnancy: Secondary | ICD-10-CM | POA: Insufficient documentation

## 2021-08-23 DIAGNOSIS — O009 Unspecified ectopic pregnancy without intrauterine pregnancy: Secondary | ICD-10-CM

## 2021-08-23 DIAGNOSIS — Z8759 Personal history of other complications of pregnancy, childbirth and the puerperium: Secondary | ICD-10-CM | POA: Insufficient documentation

## 2021-08-23 LAB — COMPREHENSIVE METABOLIC PANEL
ALT: 20 U/L (ref 0–44)
AST: 21 U/L (ref 15–41)
Albumin: 4.3 g/dL (ref 3.5–5.0)
Alkaline Phosphatase: 37 U/L — ABNORMAL LOW (ref 38–126)
Anion gap: 8 (ref 5–15)
BUN: 8 mg/dL (ref 6–20)
CO2: 26 mmol/L (ref 22–32)
Calcium: 9.8 mg/dL (ref 8.9–10.3)
Chloride: 107 mmol/L (ref 98–111)
Creatinine, Ser: 0.76 mg/dL (ref 0.44–1.00)
GFR, Estimated: >60 mL/min (ref >60)
Glucose, Bld: 93 mg/dL (ref 70–99)
Potassium: 4.7 mmol/L (ref 3.5–5.1)
Sodium: 141 mmol/L (ref 135–145)
Total Bilirubin: 0.8 mg/dL (ref 0.3–1.2)
Total Protein: 7 g/dL (ref 6.5–8.1)

## 2021-08-23 LAB — HCG, QUANTITATIVE, PREGNANCY: hCG, Beta Chain, Quant, S: 366 m[IU]/mL — ABNORMAL HIGH (ref <5)

## 2021-08-23 LAB — CBC WITH DIFFERENTIAL/PLATELET
Abs Immature Granulocytes: 0.03 10*3/uL (ref 0.00–0.07)
Basophils Absolute: 0 10*3/uL (ref 0.0–0.1)
Basophils Relative: 1 %
Eosinophils Absolute: 0.1 10*3/uL (ref 0.0–0.5)
Eosinophils Relative: 1 %
HCT: 41.2 % (ref 36.0–46.0)
Hemoglobin: 13.8 g/dL (ref 12.0–15.0)
Immature Granulocytes: 1 %
Lymphocytes Relative: 24 %
Lymphs Abs: 1.5 10*3/uL (ref 0.7–4.0)
MCH: 31.2 pg (ref 26.0–34.0)
MCHC: 33.5 g/dL (ref 30.0–36.0)
MCV: 93 fL (ref 80.0–100.0)
Monocytes Absolute: 0.6 10*3/uL (ref 0.1–1.0)
Monocytes Relative: 9 %
Neutro Abs: 4 10*3/uL (ref 1.7–7.7)
Neutrophils Relative %: 64 %
Platelets: 253 10*3/uL (ref 150–400)
RBC: 4.43 MIL/uL (ref 3.87–5.11)
RDW: 12 % (ref 11.5–15.5)
WBC: 6.2 10*3/uL (ref 4.0–10.5)
nRBC: 0 % (ref 0.0–0.2)

## 2021-08-23 MED ORDER — METHOTREXATE FOR ECTOPIC PREGNANCY
50.0000 mg/m2 | Freq: Once | INTRAMUSCULAR | Status: AC
  Administered 2021-08-23: 112.5 mg via INTRAMUSCULAR
  Filled 2021-08-23: qty 10

## 2021-08-23 NOTE — H&P (Signed)
Chief complaint ectopic pregnancy  History of the present illness: 21 year old G1 with Omair induced pregnancy due to history of infertility who presents for management of right ectopic pregnancy.  Of note patient had a plateauing beta hCG and pregnancy of unknown location last week.  Decision was made to proceed with methotrexate.  Patient had an increase of her beta-hCG from day 4 today 8.  Today on day 9 she was seen back in the office with interval development of a right adnexal mass suspicious for ectopic pregnancy.  Of note patient has always had a thin endometrial stripe no more than 3 mm.  Patient notes initial nausea with her first dose of methotrexate but otherwise has tolerated that well.  Patient notes no current abdominal pain.  Patient has risk factors for ectopic with prior appendectomy and infertility.  Patient has not had an HSG in the evaluation of her infertility.  Patient notes light bleeding starting yesterday.   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 perfumes   Past Surgical History:  Procedure Laterality Date   APPENDECTOMY     SHOULDER SURGERY     TONSILLECTOMY     WISDOM TOOTH EXTRACTION      Physical exam Vitals:   08/23/21 0926  BP: 119/67  Pulse: 75  Resp: 17  Temp: 98.1 F (36.7 C)  SpO2: 100%  Weight: 99.4 kg  Height: 6\' 1"  (1.854 m)   General: Well-appearing, no distress Skin: Warm and dry Abdomen: Obese, soft, nontender GU deferred Lower extremity, nontender, no edema      Latest Ref Rng & Units 08/23/2021    9:45 AM 08/15/2021    3:30 PM 08/07/2021    9:54 AM  CBC  WBC 4.0 - 10.5 K/uL 6.2  7.4  5.8   Hemoglobin 12.0 - 15.0 g/dL 08/09/2021  09.7  35.3   Hematocrit 36.0 - 46.0 % 41.2  39.4  43.2   Platelets 150 - 400 K/uL 253  252  262        Latest Ref Rng & Units 08/23/2021    9:45 AM 08/15/2021    3:30 PM 02/18/2021    1:13 PM  CMP  Glucose 70 - 99 mg/dL 93  04/18/2021  242   BUN 6 - 20 mg/dL 8  9  5    Creatinine 0.44 - 1.00 mg/dL 683   4.19   Sodium 135 - 145 mmol/L 141  140  139   Potassium 3.5 - 5.1 mmol/L 4.7  4.1  4.0   Chloride 98 - 111 mmol/L 107  107  108   CO2 22 - 32 mmol/L 26  27  24    Calcium 8.9 - 10.3 mg/dL 9.8  8.9  9.5   Total Protein 6.5 - 8.1 g/dL 7.0  6.7    Total Bilirubin 0.3 - 1.2 mg/dL 0.8  0.8    Alkaline Phos 38 - 126 U/L 37  35    AST 15 - 41 U/L 21  21    ALT 0 - 44 U/L 20  17     Beta-hCG 08/23/21: 366 Beta-hCG from yesterday in the office around 600  Assessment plan: 21 year old G1 with likely right ectopic pregnancy.  Presentation is interesting given patient with no abdominal pain, scant vaginal bleeding and initial nonresponse  to methotrexate but now on day 9 with about a 50% drop in her beta-hCG.  However despite this drop in hCG patient has had interval development of ultrasound finding of right adnexal mass consistent with ectopic pregnancy.  I discussed with patient options of proceeding with close outpatient management and following of beta-hCG versus second dose of methotrexate now to ensure continued resolution of the ectopic pregnancy.  Given initial nonresponse and the fact that the patient is going out of town later next week and that patient wants quicker resolution of beta-hCG and more rapid return to fertility I recommend second dose of methotrexate.  Methotrexate toxicities discussed with patient but she has tolerated this recently well.  Will give second dose of methotrexate now.  Patient has follow-up in our office on day 4 and day 7 for repeat beta-hCG.  Risks of ectopic rupture despite treatment discussed with patient and ectopic precautions reviewed with patient.  Lendon Colonel 08/23/2021 12:19 PM

## 2021-08-23 NOTE — MAU Note (Signed)
Janna Arch Orth is a 21 y.o. at [redacted]w[redacted]d here in MAU reporting: sent over for 2nd dose of MTX.  No pain, bleeding increased yesterday.  Changing every 4 hrs.  Onset of complaint: NA Pain score: denies Vitals:   08/23/21 0926  BP: 119/67  Pulse: 75  Resp: 17  Temp: 98.1 F (36.7 C)  SpO2: 100%     Lab orders placed from triage:  will call office for orders

## 2021-10-13 ENCOUNTER — Ambulatory Visit: Payer: BC Managed Care – PPO | Admitting: Neurology

## 2021-10-13 ENCOUNTER — Encounter: Payer: Self-pay | Admitting: Neurology

## 2021-10-13 VITALS — BP 127/79 | HR 96 | Ht 73.0 in | Wt 224.2 lb

## 2021-10-13 DIAGNOSIS — R42 Dizziness and giddiness: Secondary | ICD-10-CM

## 2021-10-13 MED ORDER — METHYLPREDNISOLONE 4 MG PO TBPK: ORAL_TABLET | ORAL | 1 refills | Status: DC

## 2021-10-13 MED ORDER — ALPRAZOLAM 0.25 MG PO TABS: 0.2500 mg | ORAL_TABLET | Freq: Three times a day (TID) | ORAL | 0 refills | Status: DC | PRN

## 2021-10-13 MED ORDER — ONDANSETRON 4 MG PO TBDP: 4.0000 mg | ORAL_TABLET | Freq: Three times a day (TID) | ORAL | 3 refills | Status: AC | PRN

## 2021-10-13 NOTE — Progress Notes (Signed)
[facility]    Provider:  Dr Lucia Gaskins Requesting Provider: [facility] Primary Care Provider:  [facility]  CC:  migraines  10/13/2021: Here today for acute issue of dizziness and as a follow up to migraines. This is not migraine related. She had an ectopic pregnancy. She is not on the Topiramate. For some reason her migraines have improved. She had 2 methotrexate injections. Dizziness started 2.5 weeks ago and is getting worse. No congestion, no sinus issues. Not associated with head moevements. Spinning so bad it woke her up. She sat up in bed very quickly and layed back down, she fell into the wall. Yesterday was better and even better today, not as bad as Monday. More to the left and when she looks down. Start a medrol dosepak oin case this is a neuronitis or labyrinthitis. Also show the epley maneuvers. And xanax temporarily fir vestibular symptoms and zofran for nausea.   Patient complains of symptoms per HPI as well as the following symptoms: dizzy . Pertinent negatives and positives per HPI. All others negative   HPI 03/18/2021:  Ruth Werner is a 21 y.o. female here as requested by [facility] for migraines from the emergency room. I reviewed  Henderly, Britni A, PA-C's notes from 02/18/2021: She was previously on amitriptyline. She presented with a hx of migraines, blurred vision (without diplopia or field cuts), nausea/vomiting/photophobia. No fever, head trauma, neck pain, stiffness or any other red flags for more severe conditions other than migraine. Physical and thorough neurologic exam was normal. Patient declined MRI. HA improved with a migraine cocktail. She was also seen by [facility] and I reviewed those notes in 02/2020 for migraines and that is when she was started on amitriptyline, Dr. Zada Finders, patient reported migraines about 2x a week at that time, pulsatile, radiating down her neck, excedrin and a dark room  helps, she had an eye examination prior, stays hydrated, denies any other associated symptoms.   Migraines started last July 2022, mother has migraines. Would start with little headaches and then she got a migraine and it knocked her off of her feet, amitriptyline only helped a little and she had sedative side effects couldn't tolerate increase, headaches. Since then migraines more frequent and mod to severe. Unilateral, pulsating/pounding/throbbbing, light and sound sensitive, nausea, left eye vision blurring, she had MRI at [facility] which was normal, she gets nausea, vomiting, they can be moderate to severe in pain, can last 24 hours, she takes excedrin and zofran, not working. She has 25 headache days a month, 4 severe migraine days a month, 6 moderate for a total of at least 10 migraine days a month that are moderate to severe for > 6 months. No medication overuse. No aura(left eye blurring unclear if aura but did discuss risk of stroke in women with migraine with aura). No other focal neurologic deficits, associated symptoms, inciting events or modifiable factors.   Reviewed notes, labs and imaging from outside physicians, which showed:  From a thorough chart review, meds tried that can be used in migraine management includes: Elavil(amitriptyline), ibuprofen, excedrin, naproxen, zofran, prednisone, compazine, promethazine, BP meds contrainidcated (she is usually 90 systolic today she is nervous and still has lower BP), Sumatriptan, topiramate, rizatriptan, sumatriptan.     CT showed 02/18/2021: No acute intracranial abnormalities including mass lesion or mass effect, hydrocephalus, extra-axial fluid collection, midline shift, hemorrhage, or acute infarction, large ischemic events (personally reviewed images)  02/18/2021: cbc/cmp  unremarkable   Review of Systems: Patient complains of symptoms per HPI as well as the following symptoms headache. Pertinent negatives and positives per HPI.  All others negative.   Social History   Socioeconomic History   Marital status: Single    Spouse name: Not on file   Number of children: Not on file   Years of education: Not on file   Highest education level: Not on file  Occupational History   Not on file  Tobacco Use   Smoking status: Never   Smokeless tobacco: Never  Vaping Use   Vaping Use: Never used  Substance and Sexual Activity   Alcohol use: No   Drug use: No   Sexual activity: Yes  Other Topics Concern   Not on file  Social History Narrative   Not on file   Social Determinants of Health   Financial Resource Strain: Not on file  Food Insecurity: Not on file  Transportation Needs: Not on file  Physical Activity: Not on file  Stress: Not on file  Social Connections: Not on file  Intimate Partner Violence: Not on file    Family History  Problem Relation Age of Onset   Migraines Mother    Healthy Father     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 perfumes    Patient Active Problem List   Diagnosis Date Noted   Vertigo 10/13/2021   Irritable bowel syndrome 08/07/2021   Migraine without aura and without status migrainosus, not intractable 03/20/2021   Acute low back pain 04/08/2020    Past Surgical History:  Procedure Laterality Date   APPENDECTOMY     SHOULDER SURGERY     TONSILLECTOMY     WISDOM TOOTH EXTRACTION      Current Outpatient Medications  Medication Sig Dispense Refill   ALPRAZolam (XANAX) 0.25 MG tablet Take 1-2 tablets (0.25-0.5 mg total) by mouth 3 (three) times daily as needed for anxiety. 30 tablet 0   meclizine (ANTIVERT) 25 MG tablet Take 25 mg by mouth daily as needed.     methylPREDNISolone (MEDROL DOSEPAK) 4 MG TBPK tablet Take pills daily all together with food. Take  the first dose (6 pills) as soon as possible. Take the rest each morning. For 6 days total 6-5-4-3-2-1. 21 tablet 1   ondansetron (ZOFRAN-ODT) 4 MG disintegrating tablet Place under the tongue.     ondansetron (ZOFRAN-ODT) 4 MG disintegrating tablet Take 1-2 tablets (4-8 mg total) by mouth every 8 (eight) hours as needed for nausea. Or vertigo. 30 tablet 3   No current facility-administered medications for this visit.    Allergies as of 10/13/2021   (No Known Allergies)    Vitals: BP 127/79 (BP Location: Left Arm)   Pulse 96   Ht 6\' 1"  (1.854 m)   Wt 224 lb 3.2 oz (101.7 kg)   LMP 06/28/2021   BMI 29.58 kg/m  Last Weight:  Wt Readings from Last 1 Encounters:  10/13/21 224 lb 3.2 oz (101.7 kg)   Last Height:   Ht Readings from Last 1 Encounters:  10/13/21 6\' 1"  (1.854 m)   Exam: NAD, pleasant                  Speech:    Speech is normal; fluent and spontaneous with normal  comprehension.  Cognition:    The patient is oriented to person, place, and time;     recent and remote memory intact;     language fluent;    Cranial Nerves:    The pupils are equal, round, and reactive to light.Trigeminal sensation is intact and the muscles of mastication are normal. The face is symmetric. The palate elevates in the midline. Hearing intact. Voice is normal. Shoulder shrug is normal. The tongue has normal motion without fasciculations.   Coordination:  No dysmetria  Motor Observation:    No asymmetry, no atrophy, and no involuntary movements noted. Tone:    Normal muscle tone.     Strength:    Strength is V/V in the upper and lower limbs.      Sensation: intact to LT     Assessment/Plan:  This is a patient with migraine without aura. She is feeling better as far as her migrianes but has vertigo. We performed epley (appears to be BPPV to the left) but also gave her a medrol dosepak in case this is labyrinthitis or viral inflammation in the left ear. Symptomatically try xanax and  ondansetron. Discussed side effects of sedation with xanax and not to drive, try at home first before using elsewhere  Epley maneuvers 3-4 times a day Start medrol dose pack for 6 days  Symptomatically: Take xanax or ondansetron.   I spent 30 minutes of face-to-face and non-face-to-face time with patient on the  1. Vertigo    diagnosis.  This included previsit chart review, lab review, study review, order entry, electronic health record documentation, patient education on the different diagnostic and therapeutic options, counseling and coordination of care, risks and benefits of management, compliance, or risk factor reduction   Prior appointment:  Preventative: Start Topiramate 50mg  and if not tolerated or doesn't work try Ajovy or once a month fantastic injections. Other option is [employer] which is the oral daily version of Ajovy/Emgality.  Acute: Triptans are classic medications for acute management. Rizatriptan. Please take one tablet at the onset of your headache. If it does not improve the symptoms please take one additional tablet. Do not take more then 2 tablets in 24hrs. Do not take use more then 2 to 3 times in a week. Other great options are Ubrelvy/Nurtec. Temporarily: Medrol dosepak Ondansetron: Take it with the Rizatriptan  Discussed: To prevent or relieve headaches, try the following: Cool Compress. Lie down and place a cool compress on your head.  Avoid headache triggers. If certain foods or odors seem to have triggered your migraines in the past, avoid them. A headache diary might help you identify triggers.  Include physical activity in your daily routine. Try a daily walk or other moderate aerobic exercise.  Manage stress. Find healthy ways to cope with the stressors, such as delegating tasks on your to-do list.  Practice relaxation techniques. Try deep breathing, yoga, massage and visualization.  Eat regularly. Eating regularly scheduled meals and maintaining a  healthy diet might help prevent headaches. Also, drink plenty of fluids.  Follow a regular sleep schedule. Sleep deprivation might contribute to headaches Consider biofeedback. With this mind-body technique, you learn to control certain bodily functions -- such as muscle tension, heart rate and blood pressure -- to prevent headaches or reduce headache pain.    Proceed to emergency room if you experience new or worsening symptoms or symptoms do not resolve, if you have new neurologic symptoms or if headache is severe, or for any concerning symptom.  Provided education and documentation from American headache Society toolbox including articles on: chronic migraine medication overuse headache, chronic migraines, prevention of migraines, behavioral and other nonpharmacologic treatments for headache.  Cc: [facility]  [facility]  Naomie Dean, MD  [facility] [address] [place], [place] [address]  Phone [phone] Fax [phone]

## 2021-10-13 NOTE — Patient Instructions (Signed)
Epley maneuvers 3-4 times a day Start medrol dose pack for 6 days  Symptomatically: Take xanax or ondansetron.   Ondansetron Dissolving Tablets What is this medication? ONDANSETRON (on DAN se tron) prevents nausea and vomiting from chemotherapy, radiation, or surgery. It works by blocking substances in the body that may cause nausea or vomiting. It belongs to a group of medications called antiemetics. This medicine may be used for other purposes; ask your health care provider or pharmacist if you have questions. COMMON BRAND NAME(S): Zofran ODT What should I tell my care team before I take this medication? They need to know if you have any of these conditions: Heart disease History of irregular heartbeat Liver disease Low levels of magnesium or potassium in the blood An unusual or allergic reaction to ondansetron, granisetron, other medications, foods, dyes, or preservatives Pregnant or trying to get pregnant Breast-feeding How should I use this medication? These tablets are made to dissolve in the mouth. Do not try to push the tablet through the foil backing. With dry hands, peel away the foil backing and gently remove the tablet. Place the tablet in the mouth and allow it to dissolve, then swallow. While you may take these tablets with water, it is not necessary to do so. Talk to your care team regarding the use of this medication in children. Special care may be needed. Overdosage: If you think you have taken too much of this medicine contact a poison control center or emergency room at once. NOTE: This medicine is only for you. Do not share this medicine with others. What if I miss a dose? If you miss a dose, take it as soon as you can. If it is almost time for your next dose, take only that dose. Do not take double or extra doses. What may interact with this medication? Do not take this medication with any of the following: Apomorphine Certain medications for fungal infections like  fluconazole, itraconazole, ketoconazole, posaconazole, voriconazole Cisapride Dronedarone Pimozide Thioridazine This medication may also interact with the following: Carbamazepine Certain medications for depression, anxiety, or psychotic disturbances Fentanyl Linezolid MAOIs like Carbex, Eldepryl, Marplan, Nardil, and Parnate Methylene blue (injected into a vein) Other medications that prolong the QT interval (cause an abnormal heart rhythm) like dofetilide, ziprasidone Phenytoin Rifampicin Tramadol This list may not describe all possible interactions. Give your health care provider a list of all the medicines, herbs, non-prescription drugs, or dietary supplements you use. Also tell them if you smoke, drink alcohol, or use illegal drugs. Some items may interact with your medicine. What should I watch for while using this medication? Check with your care team as soon as you can if you have any sign of an allergic reaction. What side effects may I notice from receiving this medication? Side effects that you should report to your care team as soon as possible: Allergic reactions--skin rash, itching, hives, swelling of the face, lips, tongue, or throat Bowel blockage--stomach cramping, unable to have a bowel movement or pass gas, loss of appetite, vomiting Chest pain (angina)--pain, pressure, or tightness in the chest, neck, back, or arms Heart rhythm changes--fast or irregular heartbeat, dizziness, feeling faint or lightheaded, chest pain, trouble breathing Irritability, confusion, fast or irregular heartbeat, muscle stiffness, twitching muscles, sweating, high fever, seizure, chills, vomiting, diarrhea, which may be signs of serotonin syndrome Side effects that usually do not require medical attention (report to your care team if they continue or are bothersome): Constipation Diarrhea General discomfort and fatigue Headache This  list may not describe all possible side effects. Call your  doctor for medical advice about side effects. You may report side effects to FDA at 1-800-FDA-1088. Where should I keep my medication? Keep out of the reach of children and pets. Store between 2 and 30 degrees C (36 and 86 degrees F). Throw away any unused medication after the expiration date. NOTE: This sheet is a summary. It may not cover all possible information. If you have questions about this medicine, talk to your doctor, pharmacist, or health care provider.  2023 Elsevier/Gold Standard (2004-01-22 00:00:00) Alprazolam Tablets What is this medication? ALPRAZOLAM (al PRAY zoe lam) treats anxiety. It works by Education administratorhelping your nervous system calm down. It belongs to a group of medications called benzodiazepines. This medicine may be used for other purposes; ask your health care provider or pharmacist if you have questions. COMMON BRAND NAME(S): Xanax What should I tell my care team before I take this medication? They need to know if you have any of these conditions: Kidney disease Liver disease Lung disease, such as asthma or breathing problems Mental health condition Seizures Substance use disorder Suicidal thoughts, plans, or attempt by you or a family member An unusual or allergic reaction to alprazolam, other medications, foods, dyes, or preservatives Pregnant or trying to get pregnant Breastfeeding How should I use this medication? Take this medication by mouth. Take it as directed on the prescription label. Do not take it more often than directed. Keep taking it unless your care team tells you to stop. A special MedGuide will be given to you by the pharmacist with each prescription and refill. Be sure to read this information carefully each time. Talk to your care team about the use of this medication in children. Special care may be needed. People 65 years and older may have a stronger reaction and need a smaller dose. Overdosage: If you think you have taken too much of this  medicine contact a poison control center or emergency room at once. NOTE: This medicine is only for you. Do not share this medicine with others. What if I miss a dose? If you miss a dose, take it as soon as you can. If it is almost time for your next dose, take only that dose. Do not take double or extra doses. What may interact with this medication? Do not take this medication with any of the following: Adagrasib Certain antivirals for HIV or hepatitis Certain medications for fungal infections, such as ketoconazole, itraconazole, or posaconazole Clarithromycin Grapefruit juice Opioid medications for cough Sodium oxybate This medication may also interact with the following: Alcohol Antihistamines for allergy, cough and cold Certain medications for anxiety or sleep Certain medications for depression, such as amitriptyline, fluoxetine, fluvoxamine, nefazodone, sertraline Certain medications for seizures, such as carbamazepine, phenobarbital, phenytoin, primidone Cimetidine Digoxin Erythromycin Estrogen or progestin hormones General anesthetics, such as halothane, isoflurane, methoxyflurane, propofol Medications that relax muscles Opioid medications for pain Phenothiazines, such as chlorpromazine, mesoridazine, prochlorperazine, thioridazine This list may not describe all possible interactions. Give your health care provider a list of all the medicines, herbs, non-prescription drugs, or dietary supplements you use. Also tell them if you smoke, drink alcohol, or use illegal drugs. Some items may interact with your medicine. What should I watch for while using this medication? Visit your care team for regular checks on your progress. Tell your care team if your symptoms do not start to get better or if they get worse. Do not stop taking except on  your care team's advice. You may develop a severe reaction. Your care team will tell you how much medication to take. Long term use of this  medication may cause your brain and body to depend on it. This can happen even when used as directed by your care team. You and your care team will work together to determine how long you will need to take this medication. This medication may affect your coordination, reaction time, or judgment. Do not drive or operate machinery until you know how this medication affects you. Sit up or stand slowly to reduce the risk of dizzy or fainting spells. Drinking alcohol with this medication can increase the risk of these side effects. If you are taking another medication that also causes drowsiness, you may have more side effects. Give your care team a list of all medications you use. Your care team will tell you how much medication to take. Do not take more medication than directed. Get emergency help right away if you have problems breathing or unusual sleepiness. If you or your family notice any changes in your behavior, such as new or worsening depression, thoughts of harming yourself, anxiety, other unusual or disturbing thoughts, or memory loss, call your care team right away. Talk to your care team if you wish to become pregnant or think you might be pregnant. This medication can cause serious birth defects. Talk to your care team before breastfeeding. Changes to your treatment plan may be needed. What side effects may I notice from receiving this medication? Side effects that you should report to your care team as soon as possible: Allergic reactions--skin rash, itching, hives, swelling of the face, lips, tongue, or throat CNS depression--slow or shallow breathing, shortness of breath, feeling faint, dizziness, confusion, trouble staying awake Thoughts of suicide or self-harm, worsening mood, feelings of depression Side effects that usually do not require medical attention (report to your care team if they continue or are bothersome): Change in sex drive or performance Dizziness Drowsiness Nausea This  list may not describe all possible side effects. Call your doctor for medical advice about side effects. You may report side effects to FDA at 1-800-FDA-1088. Where should I keep my medication? Keep out of the reach of children and pets. This medication can be abused. Keep it in a safe place to protect it from theft. Do not share it with anyone. It is only for you. Selling or giving away this medication is dangerous and against the law. Store at room temperature between 20 and 25 degrees C (68 and 77 degrees F). Get rid of any unused medication after the expiration date. This medication may cause harm and death if it is taken by other adults, children, or pets. It is important to get rid of the medication as soon as you no longer need it, or it is expired. You can do this in two ways: Take the medication to a medication take-back program. Check with your pharmacy or law enforcement to find a location. If you cannot return the medication, check the label or package insert to see if the medication should be thrown out in the garbage or flushed down the toilet. If you are not sure, ask your care team. If it is safe to put it in the trash, take the medication out of the container. Mix the medication with cat litter, dirt, coffee grounds, or other unwanted substance. Seal the mixture in a bag or container. Put it in the trash. NOTE: This sheet is  a summary. It may not cover all possible information. If you have questions about this medicine, talk to your doctor, pharmacist, or health care provider.  2023 Elsevier/Gold Standard (2004-02-02 00:00:00) Methylprednisolone Tablets What is this medication? METHYLPREDNISOLONE (meth ill pred NISS oh lone) treats many conditions such as asthma, allergic reactions, arthritis, inflammatory bowel diseases, adrenal, and blood or bone marrow disorders. It works by decreasing inflammation, slowing down an overactive immune system, or replacing cortisol normally made in the  body. Cortisol is a hormone that plays an important role in how the body responds to stress, illness, and injury. It belongs to a group of medications called steroids. This medicine may be used for other purposes; ask your health care provider or pharmacist if you have questions. COMMON BRAND NAME(S): Medrol, Medrol Dosepak What should I tell my care team before I take this medication? They need to know if you have any of these conditions: Cushing's syndrome Eye disease, vision problems Diabetes Glaucoma Heart disease High blood pressure Infection (especially a virus infection such as chickenpox, cold sores, or herpes) Liver disease Mental illness Myasthenia gravis Osteoporosis Recently received or scheduled to receive a vaccine Seizures Stomach or intestine problems Thyroid disease An unusual or allergic reaction to lactose, methylprednisolone, other medications, foods, dyes, or preservatives Pregnant or trying to get pregnant Breast-feeding How should I use this medication? Take this medication by mouth with a glass of water. Follow the directions on the prescription label. Take this medication with food. If you are taking this medication once a day, take it in the morning. Do not take it more often than directed. Do not suddenly stop taking your medication because you may develop a severe reaction. Your care team will tell you how much medication to take. If your care team wants you to stop the medication, the dose may be slowly lowered over time to avoid any side effects. Talk to your care team about the use of this medication in children. Special care may be needed. Overdosage: If you think you have taken too much of this medicine contact a poison control center or emergency room at once. NOTE: This medicine is only for you. Do not share this medicine with others. What if I miss a dose? If you miss a dose, take it as soon as you can. If it is almost time for your next dose, talk to  your care team. You may need to miss a dose or take an extra dose. Do not take double or extra doses without advice. What may interact with this medication? Do not take this medication with any of the following: Alefacept Echinacea Live virus vaccines Metyrapone Mifepristone This medication may also interact with the following: Amphotericin B Aspirin and aspirin-like medications Certain antibiotics like erythromycin, clarithromycin, troleandomycin Certain medications for diabetes Certain medications for fungal infections like ketoconazole Certain medications for seizures like carbamazepine, phenobarbital, phenytoin Certain medications that treat or prevent blood clots like warfarin Cholestyramine Cyclosporine Digoxin Diuretics Female hormones, like estrogens and birth control pills Isoniazid NSAIDs, medications for pain inflammation, like ibuprofen or naproxen Other medications for myasthenia gravis Rifampin Vaccines This list may not describe all possible interactions. Give your health care provider a list of all the medicines, herbs, non-prescription drugs, or dietary supplements you use. Also tell them if you smoke, drink alcohol, or use illegal drugs. Some items may interact with your medicine. What should I watch for while using this medication? Tell your care team if your symptoms do not start to  get better or if they get worse. Do not stop taking except on your care team's advice. You may develop a severe reaction. Your care team will tell you how much medication to take. This medication may increase your risk of getting an infection. Tell your care team if you are around anyone with measles or chickenpox, or if you develop sores or blisters that do not heal properly. This medication may increase blood sugar levels. Ask your care team if changes in diet or medications are needed if you have diabetes. Tell your care team right away if you have any change in your eyesight. Using  this medication for a long time may increase your risk of low bone mass. Talk to your care team about bone health. What side effects may I notice from receiving this medication? Side effects that you should report to your care team as soon as possible: Allergic reactions--skin rash, itching, hives, swelling of the face, lips, tongue, or throat Cushing syndrome--increased fat around the midsection, upper back, neck, or face, pink or purple stretch marks on the skin, thinning, fragile skin that easily bruises, unexpected hair growth High blood sugar (hyperglycemia)--increased thirst or amount of urine, unusual weakness or fatigue, blurry vision Increase in blood pressure Infection--fever, chills, cough, sore throat, wounds that don't heal, pain or trouble when passing urine, general feeling of discomfort or being unwell Low adrenal gland function--nausea, vomiting, loss of appetite, unusual weakness or fatigue, dizziness Mood and behavior changes--anxiety, nervousness, confusion, hallucinations, irritability, hostility, thoughts of suicide or self-harm, worsening mood, feelings of depression Stomach bleeding--bloody or black, tar-like stools, vomiting blood or brown material that looks like coffee grounds Swelling of the ankles, hands, or feet Side effects that usually do not require medical attention (report to your care team if they continue or are bothersome): Acne General discomfort and fatigue Headache Increase in appetite Nausea Trouble sleeping Weight gain This list may not describe all possible side effects. Call your doctor for medical advice about side effects. You may report side effects to FDA at 1-800-FDA-1088. Where should I keep my medication? Keep out of the reach of children and pets. Store at room temperature between 20 and 25 degrees C (68 and 77 degrees F). Throw away any unused medication after the expiration date. NOTE: This sheet is a summary. It may not cover all  possible information. If you have questions about this medicine, talk to your doctor, pharmacist, or health care provider.  2023 Elsevier/Gold Standard (2007-02-23 00:00:00)

## 2021-10-27 ENCOUNTER — Encounter: Payer: Self-pay | Admitting: *Deleted

## 2021-11-15 ENCOUNTER — Ambulatory Visit: Payer: No Typology Code available for payment source | Admitting: Neurology

## 2022-09-12 ENCOUNTER — Ambulatory Visit (INDEPENDENT_AMBULATORY_CARE_PROVIDER_SITE_OTHER): Payer: BC Managed Care – PPO | Admitting: Podiatry

## 2022-09-12 ENCOUNTER — Ambulatory Visit (INDEPENDENT_AMBULATORY_CARE_PROVIDER_SITE_OTHER): Payer: BC Managed Care – PPO

## 2022-09-12 DIAGNOSIS — M79672 Pain in left foot: Secondary | ICD-10-CM | POA: Diagnosis not present

## 2022-09-12 DIAGNOSIS — M722 Plantar fascial fibromatosis: Secondary | ICD-10-CM

## 2022-09-12 MED ORDER — MELOXICAM 15 MG PO TABS: 15.0000 mg | ORAL_TABLET | Freq: Every day | ORAL | 3 refills | Status: DC

## 2022-09-12 NOTE — Progress Notes (Signed)
  Subjective:  Patient ID: Ruth Werner, female    DOB: 2000/06/21,  MRN: [id number]  Chief Complaint  Patient presents with   Foot Pain    Pt present today with pain in the left heel that has been going on for sometimes now the pt stated.    22 y.o. female presents with the above complaint. History confirmed with patient.  She has had it for several months.  Also has a history of back issues as well and has had treatment for this.  Previously had orthotics made in [facility] for arch support and also to address limb length discrepancy, she purchased new [employer] shoes which have been more comfortable but her orthotics do not fit in them.  Objective:  Physical Exam: warm, good capillary refill, no trophic changes or ulcerative lesions, normal DP and PT pulses, normal sensory exam, and sharp pain on palpation to plantar medial insertion of plantar fascia on calcaneus, she has significant gastrocnemius equinus.   Radiographs: Multiple views x-ray of the left foot: no fracture, dislocation, swelling or degenerative changes noted and plantar calcaneal spur Assessment:   1. Plantar fasciitis of left foot      Plan:  Patient was evaluated and treated and all questions answered.  Discussed the etiology and treatment options for plantar fasciitis including stretching, formal physical therapy, supportive shoegears such as a running shoe or sneaker, pre fabricated orthoses, injection therapy, and oral medications. We also discussed the role of surgical treatment of this for patients who do not improve after exhausting non-surgical treatment options.   -XR reviewed with patient -Educated patient on stretching and icing of the affected limb -Injection delivered to the plantar fascia of the left foot. -Rx for meloxicam. Educated on use, risks and benefits of the medication.  Currently undergoing fertility treatments, she has been cleared to take Mobic by her physician for this.  Discussed if she  is still taking it when she becomes pregnant she should stop taking it.  After sterile prep with povidone-iodine solution and alcohol, the left heel was injected with 0.5cc 2% xylocaine plain, 0.5cc 0.5% marcaine plain, 5mg  triamcinolone acetonide, and 2mg  dexamethasone was injected along the medial plantar fascia at the insertion on the plantar calcaneus. The patient tolerated the procedure well without complication.  Return in about 6 weeks (around 10/24/2022) for recheck plantar fasciitis.

## 2022-09-12 NOTE — Patient Instructions (Signed)

## 2022-10-19 ENCOUNTER — Ambulatory Visit
Admission: EM | Admit: 2022-10-19 | Discharge: 2022-10-19 | Disposition: A | Discharge status: Home / Self Care | Payer: BC Managed Care – PPO | Attending: Emergency Medicine | Admitting: Emergency Medicine

## 2022-10-19 DIAGNOSIS — J069 Acute upper respiratory infection, unspecified: Secondary | ICD-10-CM | POA: Diagnosis present

## 2022-10-19 DIAGNOSIS — J0100 Acute maxillary sinusitis, unspecified: Secondary | ICD-10-CM

## 2022-10-19 DIAGNOSIS — Z20822 Contact with and (suspected) exposure to covid-19: Secondary | ICD-10-CM

## 2022-10-19 LAB — SARS CORONAVIRUS 2 BY RT PCR: SARS Coronavirus 2 by RT PCR: NEGATIVE

## 2022-10-19 LAB — GROUP A STREP BY PCR: Group A Strep by PCR: NOT DETECTED

## 2022-10-19 MED ORDER — AMOXICILLIN-POT CLAVULANATE 875-125 MG PO TABS: 1.0000 | ORAL_TABLET | Freq: Two times a day (BID) | ORAL | 0 refills | Status: DC

## 2022-10-19 MED ORDER — NAPROXEN 500 MG PO TABS: 500.0000 mg | ORAL_TABLET | Freq: Two times a day (BID) | ORAL | 0 refills | Status: AC

## 2022-10-19 MED ORDER — FLUTICASONE PROPIONATE 50 MCG/ACT NA SUSP: 2.0000 | Freq: Every day | NASAL | 0 refills | Status: DC

## 2022-10-19 NOTE — Discharge Instructions (Signed)
Your strep is negative.  We will contact you if and only if your COVID comes back positive.  In the meantime, Mucinex D, Naprosyn, with Tylenol 1000 mg twice a day, Promethazine DM as needed for cough.  Saline nasal irrigation with a Lloyd Huger Med rinse and distilled water as often as you want, Flonase.  I am sending home with a wait-and-see prescription of Augmentin.  I would wait a few days to fill this.  Fill this if you get worse, start having fevers again, facial swelling, or worsening upper dental pain.

## 2022-10-19 NOTE — ED Triage Notes (Signed)
Sore throat, fever, cough, nasal and chest congestion, loose stools symptoms started on Saturday

## 2022-10-19 NOTE — ED Provider Notes (Signed)
HPI  SUBJECTIVE:  Ruth Werner is a 22 y.o. female who presents with 4 days of nasal congestion, sore throat, headache, dry cough, fevers Tmax 100.9, body aches, rhinorrhea, sinus pain and pressure, upper dental pain, postnasal drip, diarrhea.  No facial swelling, wheezing, shortness of breath, nausea, vomiting, abdominal pain, rash.  No drooling, trismus, voice changes, neck stiffness, sensation of throat swelling shut.  No known COVID, flu, strep exposure.  She got 2 doses of the COVID vaccine, has not yet gotten this years flu vaccine.  She is unable to sleep at night because of the cough.  She has been taking Tylenol 1000 mg every 8 hours, Tamiflu with improvement in her symptoms.  No aggravating factors.  Last dose of Tylenol was within 6 hours of evaluation.  No antibiotics in the past month.  She has no past medical history.  LMP: September 25.  Denies possibility of being pregnant.  [facility]    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 perfumes    Past Surgical History:  Procedure Laterality Date   APPENDECTOMY     SHOULDER SURGERY     TONSILLECTOMY     WISDOM TOOTH EXTRACTION      Family History  Problem Relation Age of Onset   Migraines Mother    Healthy Father     Social History   Tobacco Use   Smoking status: Never   Smokeless tobacco: Never  Vaping Use   Vaping status: Never Used  Substance Use Topics   Alcohol use: No   Drug use: No    No current facility-administered medications for this encounter.  Current Outpatient Medications:    amoxicillin-clavulanate (AUGMENTIN) 875-125 MG tablet, Take 1 tablet by mouth every 12 (twelve) hours., Disp: 14 tablet, Rfl: 0   fluticasone (FLONASE) 50 MCG/ACT nasal spray, Place 2 sprays into both  nostrils daily., Disp: 16 g, Rfl: 0   letrozole (FEMARA) 2.5 MG tablet, Take 10 mg by mouth daily., Disp: , Rfl:    metformin (FORTAMET) 1000 MG (OSM) 24 hr tablet, Take 2,000 mg by mouth daily with breakfast., Disp: , Rfl:    naproxen (NAPROSYN) 500 MG tablet, Take 1 tablet (500 mg total) by mouth 2 (two) times daily., Disp: 20 tablet, Rfl: 0   meclizine (ANTIVERT) 25 MG tablet, Take 25 mg by mouth daily as needed., Disp: , Rfl:    ondansetron (ZOFRAN-ODT) 4 MG disintegrating tablet, Take 1-2 tablets (4-8 mg total) by mouth every 8 (eight) hours as needed for nausea. Or vertigo., Disp: 30 tablet, Rfl: 3  No Known Allergies   ROS  As noted in HPI.   Physical Exam  BP 124/87   Pulse 82   Temp 98.4 F (36.9 C)   Resp 18   LMP 10/11/2022   SpO2 100%   Constitutional: Well developed, well nourished, no acute distress Eyes:  EOMI, conjunctiva normal bilaterally HENT: Normocephalic, atraumatic,mucus membranes moist.  Mucoid nasal congestion.  Erythematous, swollen turbinates.  Maxillary sinus tenderness.  No frontal sinus tenderness.  Tonsils surgically absent.  Normal oropharynx.  No cobblestoning, postnasal drip. Neck: Positive cervical lymphadenopathy Respiratory: Normal inspiratory effort, lungs clear bilaterally, good air movement.  No anterior, lateral chest wall tenderness Cardiovascular: Normal rate, rhythm, no murmurs rubs or gallops. GI:  nondistended skin: No rash, skin intact Musculoskeletal: no deformities Neurologic: Alert & oriented x 3, no focal neuro deficits Psychiatric: Speech and behavior appropriate   ED Course   Medications - No data to display  Orders Placed This Encounter  Procedures   Group A Strep by PCR    Standing Status:   Standing    Number of Occurrences:   1   SARS Coronavirus 2 by RT PCR (hospital order, performed in [facility] hospital lab) *cepheid single result test* Anterior Nasal Swab    Standing Status:   Standing    Number of  Occurrences:   1    Results for orders placed or performed during the hospital encounter of 10/19/22 (from the past 24 hour(s))  Group A Strep by PCR     Status: None   Collection Time: 10/19/22  9:26 AM   Specimen: Throat; Sterile Swab  Result Value Ref Range   Group A Strep by PCR NOT DETECTED NOT DETECTED  SARS Coronavirus 2 by RT PCR (hospital order, performed in [facility] hospital lab) *cepheid single result test* Throat     Status: None   Collection Time: 10/19/22  9:26 AM   Specimen: Throat; Nasal Swab  Result Value Ref Range   SARS Coronavirus 2 by RT PCR NEGATIVE NEGATIVE   No results found.  ED Clinical Impression  1. Upper respiratory tract infection, unspecified type   2. Acute non-recurrent maxillary sinusitis   3. Encounter for laboratory testing for COVID-19 virus      ED Assessment/Plan     Presentation consistent with an upper respiratory infection with resulting sinusitis.  Checking strep, COVID.    Strep, COVID PCR negative.  She is to discontinue Tamiflu, other cold flu medicines, start Mucinex D, Naprosyn combined with Tylenol twice daily, saline nasal occasion, Flonase, Promethazine DM as needed for cough.  Wait-and-see prescription of Augmentin for sinusitis.  Discussed indications for starting this.  Work note for today.  Follow-up with PCP as needed.  Discussed labs,  MDM, treatment plan, and plan for follow-up with patient. Discussed sn/sx that should prompt return to the ED. patient agrees with plan.   Meds ordered this encounter  Medications   fluticasone (FLONASE) 50 MCG/ACT nasal spray    Sig: Place 2 sprays into both nostrils daily.    Dispense:  16 g    Refill:  0   amoxicillin-clavulanate (AUGMENTIN) 875-125 MG tablet    Sig: Take 1 tablet by mouth every 12 (twelve) hours.    Dispense:  14 tablet    Refill:  0   naproxen (NAPROSYN) 500 MG tablet    Sig: Take 1 tablet (500 mg total) by mouth 2 (two) times daily.    Dispense:  20 tablet     Refill:  0      *This clinic note was created using Scientist, clinical (histocompatibility and immunogenetics). Therefore, there may be occasional mistakes despite careful proofreading.  ?    Domenick Gong, MD 10/20/22 1459

## 2022-10-24 ENCOUNTER — Ambulatory Visit: Payer: BC Managed Care – PPO | Admitting: Podiatry

## 2022-10-24 ENCOUNTER — Encounter: Payer: Self-pay | Admitting: Podiatry

## 2022-10-24 VITALS — BP 113/73 | HR 96 | Temp 97.7°F | Resp 18 | Ht 74.0 in | Wt 236.0 lb

## 2022-10-24 DIAGNOSIS — M722 Plantar fascial fibromatosis: Secondary | ICD-10-CM

## 2022-10-24 DIAGNOSIS — M62462 Contracture of muscle, left lower leg: Secondary | ICD-10-CM

## 2022-10-24 NOTE — Progress Notes (Signed)
Subjective:  Patient ID: Ruth Werner, female    DOB: 03-15-2000,  MRN: [id number]  Chief Complaint  Patient presents with   Plantar Fasciitis    FOLLOW UP PF    22 y.o. female presents with the above complaint. History confirmed with patient.  She feels that it is about 20 to 30% better.  Has not had much injection did not last long Objective:  Physical Exam: warm, good capillary refill, no trophic changes or ulcerative lesions, normal DP and PT pulses, normal sensory exam, and sharp pain on palpation to plantar medial insertion of plantar fascia on calcaneus, she has significant gastrocnemius equinus.   Radiographs: Multiple views x-ray of the left foot: no fracture, dislocation, swelling or degenerative changes noted and plantar calcaneal spur Assessment:   1. Plantar fasciitis of left foot   2. Gastrocnemius equinus of left lower extremity      Plan:  Patient was evaluated and treated and all questions answered.  Unfortunate has not improved much only about 20 to 30% better after the injection and the meloxicam.  I recommend immobilization and support in a cam walker boot and this was dispensed today.  I also recommended formal physical therapy and referral was sent to [facility] here in [place] at our office.  I also recommend an MRI to evaluate the plantar fascia and consider surgical options.  I will see her back after the studies to discuss her further treatment options  No follow-ups on file.

## 2022-10-24 NOTE — Patient Instructions (Signed)
Call [facility] to schedule your MRI at the below locations.  Please allow at least 1 business day after your visit to process the referral.  It may take longer depending on approval from insurance.  Please let me know if you have issues or problems scheduling the MRI   [facility] [phone] [address] [place], [place] [address]  [facility] [phone] [address] [place], [place] [address]  

## 2022-10-25 ENCOUNTER — Telehealth: Payer: Self-pay | Admitting: Podiatry

## 2022-10-25 DIAGNOSIS — M722 Plantar fascial fibromatosis: Secondary | ICD-10-CM

## 2022-10-25 NOTE — Telephone Encounter (Signed)
Pt called and would like the mri done at [facility] as that is where she works and they do them for the employee's

## 2022-10-26 NOTE — Telephone Encounter (Signed)
Left message for pt to call back to give me fax number to fax the mri orders to her job and she may want to check for prior auth. I explained she would also need to bring the disc with her to the follow up appt with you.

## 2022-10-27 NOTE — Telephone Encounter (Signed)
Pt called back and gave me the fax number [phone] and I have faxed mri orders to that location.

## 2022-11-10 ENCOUNTER — Encounter: Payer: Self-pay | Admitting: Podiatry

## 2023-11-18 IMAGING — CT CT HEAD W/O CM
4 series · 16 of 47 positions shown, 18 images · non-contrast
Comparison: Brain MRI 08/09/2011.

CLINICAL DATA: Provided history: Headache, chronic, new features or
increased frequency. Additional history provided: Patient reports
migraine for 3 days, blurred vision in left eye.



[Series 3: sag soft · sagittal · 0.37mm/px · 3 of 63 slices shown]
[im 21/63  brain]
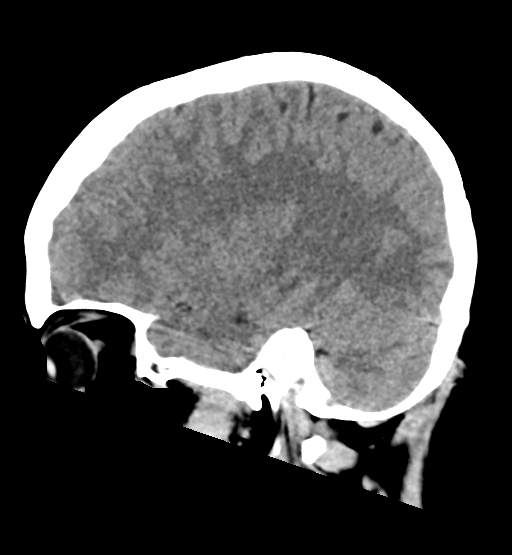
[im 32/63  brain]
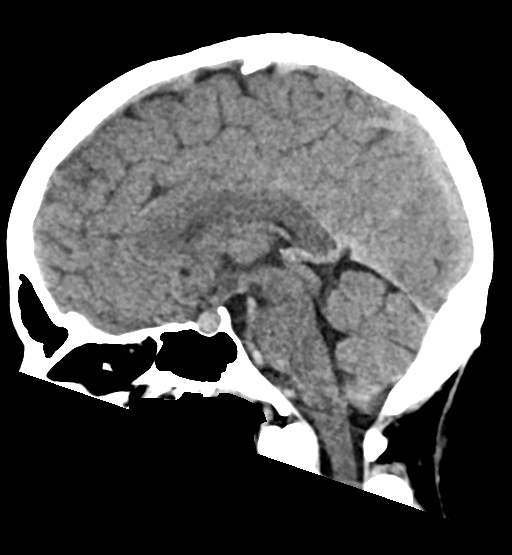
[im 42/63  brain]
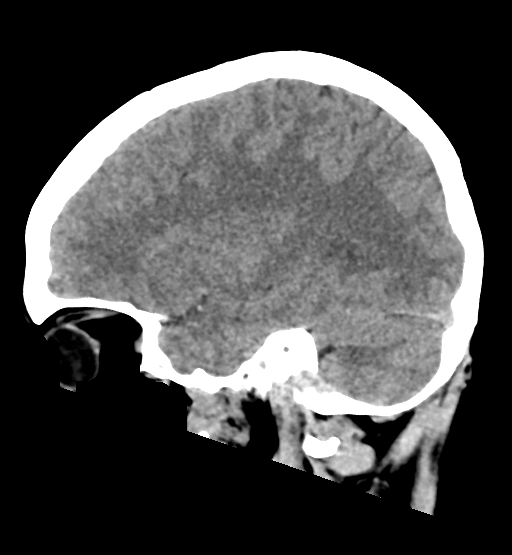

[Series 4: head wo · axial · 0.39mm/px · z∈[-80,+40]mm · 7 of 33 slices shown, 9 images]
[im 5/33  brain]
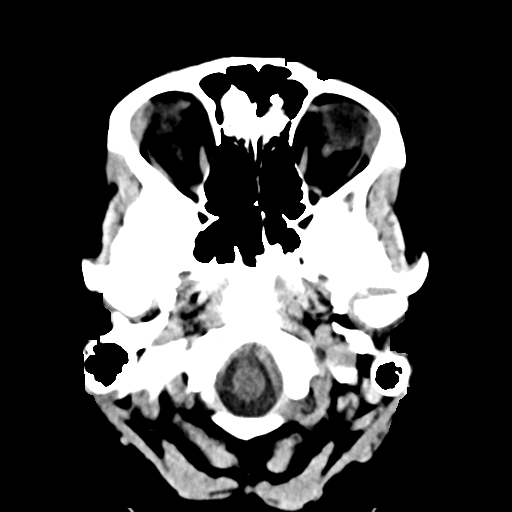
[im 5/33  bone]
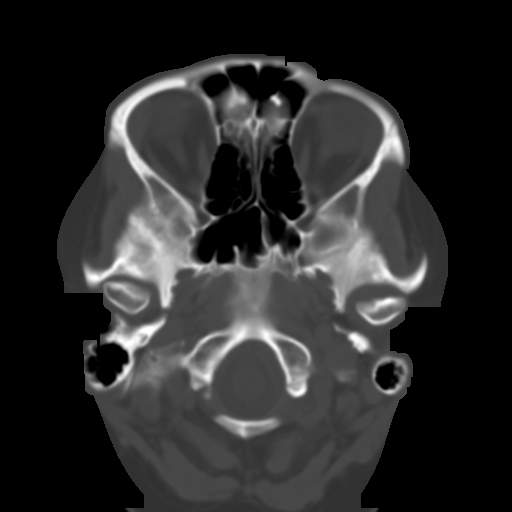
[im 9/33  brain]
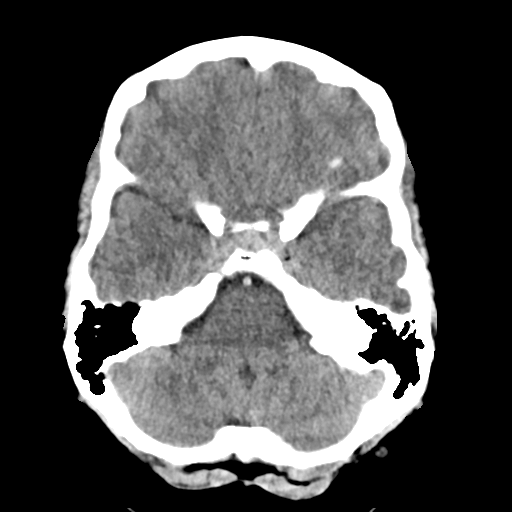
[im 13/33  brain]
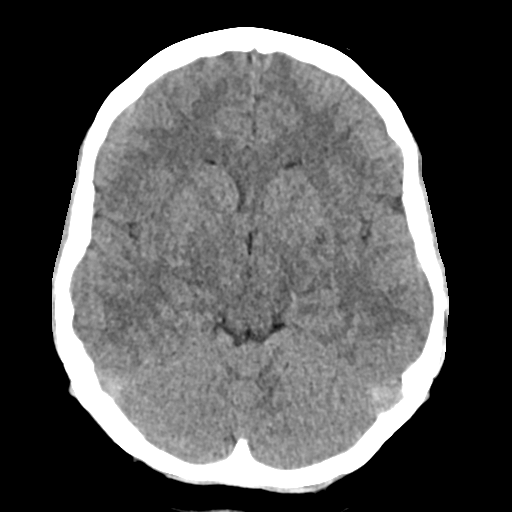
[im 17/33  brain]
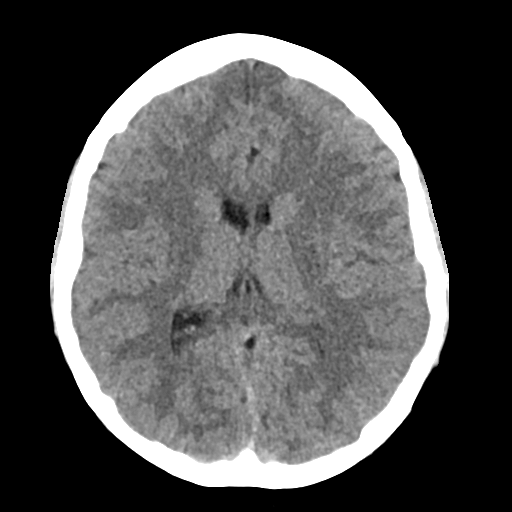
[im 21/33  brain]
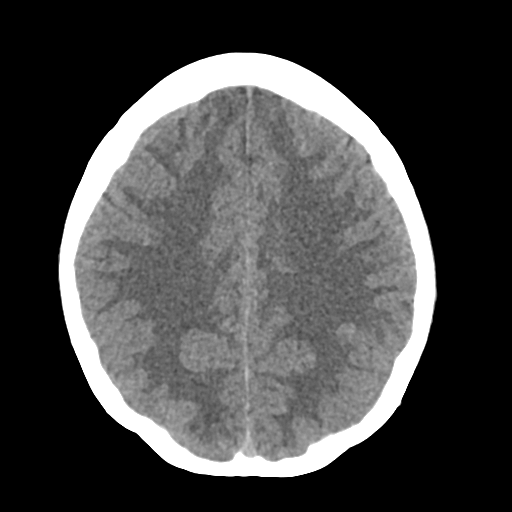
[im 21/33  bone]
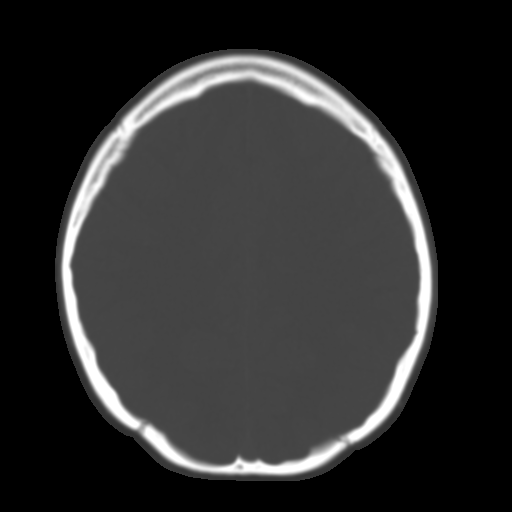
[im 25/33  brain]
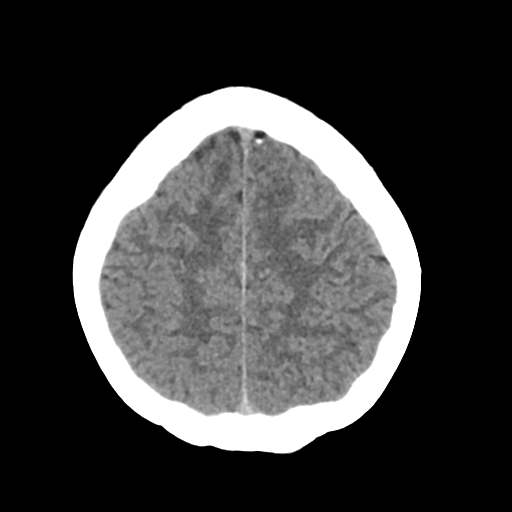
[im 29/33  brain]
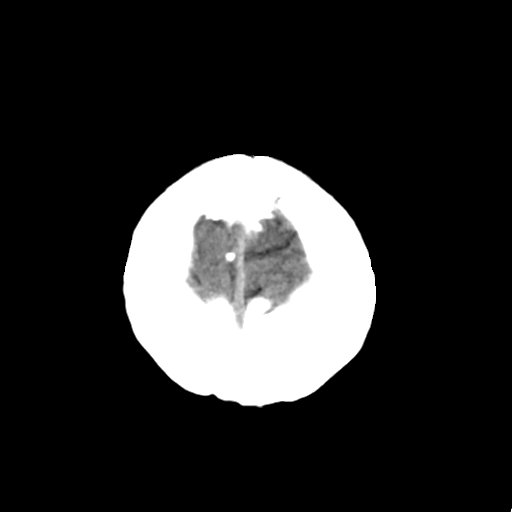

[Series 5: head bone · axial · 0.46mm/px · z∈[-84,-52]mm · 3 of 83 slices shown]
[im 9/83  bone]
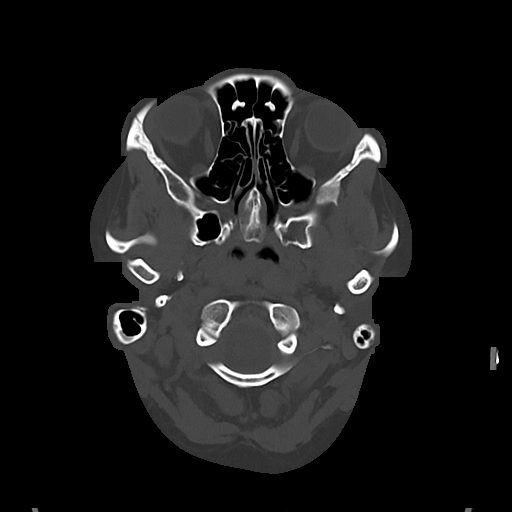
[im 17/83  bone]
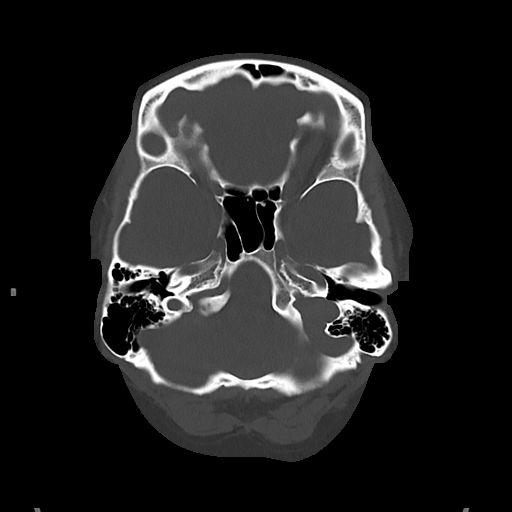
[im 25/83  bone]
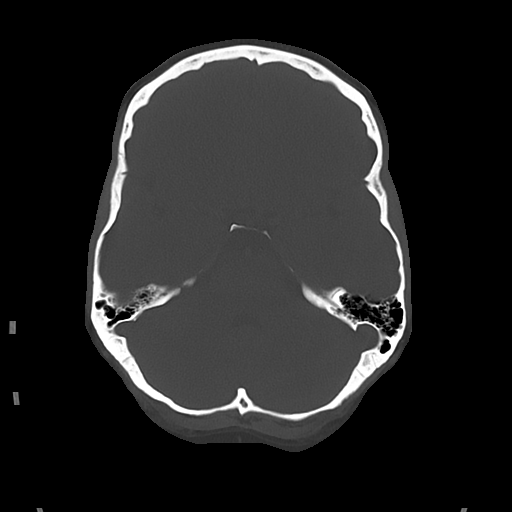

[Series 6: cor soft · coronal · 0.37mm/px · 3 of 64 slices shown]
[im 22/64  brain]
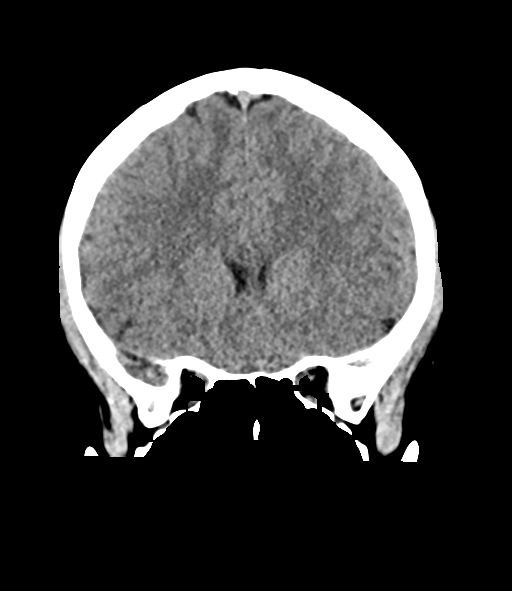
[im 29/64  brain]
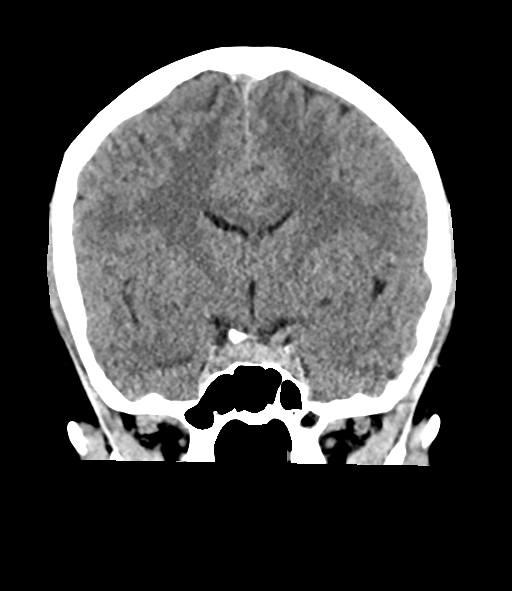
[im 36/64  brain]
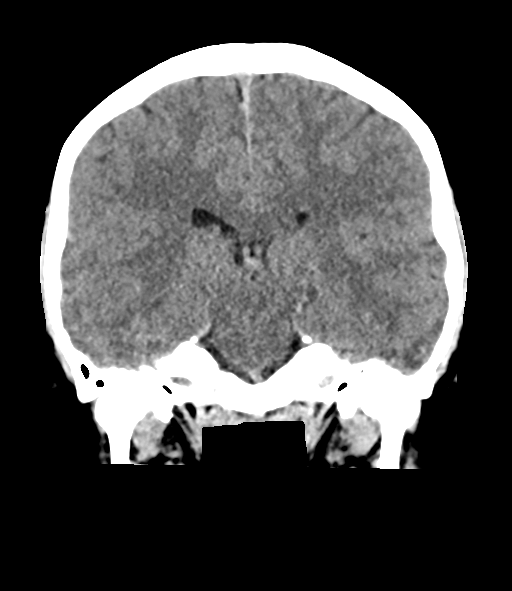

[16 of 47 positions shown; findings below may reference images not displayed]

FINDINGS: Brain:

Cerebral volume is normal.

There is no acute intracranial hemorrhage.

No demarcated cortical infarct.

No extra-axial fluid collection.

No evidence of an intracranial mass.

No midline shift.

Vascular: No hyperdense vessel.

Skull: Normal. Negative for fracture or focal lesion.

Sinuses/Orbits: Visualized orbits show no acute finding. No
significant paranasal sinus disease at the imaged levels.
IMPRESSION: Unremarkable non-contrast CT appearance of the brain. No evidence of
acute intracranial abnormality.

## 2023-12-07 ENCOUNTER — Ambulatory Visit: Payer: Self-pay | Admitting: General Practice

## 2023-12-07 ENCOUNTER — Ambulatory Visit: Payer: PRIVATE HEALTH INSURANCE | Admitting: General Practice

## 2023-12-07 ENCOUNTER — Encounter: Payer: Self-pay | Admitting: General Practice

## 2023-12-07 VITALS — BP 104/62 | HR 102 | Temp 98.1°F | Ht 73.0 in | Wt 244.0 lb

## 2023-12-07 DIAGNOSIS — E559 Vitamin D deficiency, unspecified: Secondary | ICD-10-CM

## 2023-12-07 DIAGNOSIS — Z23 Encounter for immunization: Secondary | ICD-10-CM | POA: Diagnosis not present

## 2023-12-07 DIAGNOSIS — Z7689 Persons encountering health services in other specified circumstances: Secondary | ICD-10-CM | POA: Diagnosis not present

## 2023-12-07 DIAGNOSIS — E538 Deficiency of other specified B group vitamins: Secondary | ICD-10-CM | POA: Diagnosis not present

## 2023-12-07 DIAGNOSIS — M722 Plantar fascial fibromatosis: Secondary | ICD-10-CM | POA: Insufficient documentation

## 2023-12-07 DIAGNOSIS — F32A Depression, unspecified: Secondary | ICD-10-CM

## 2023-12-07 DIAGNOSIS — F419 Anxiety disorder, unspecified: Secondary | ICD-10-CM

## 2023-12-07 DIAGNOSIS — N979 Female infertility, unspecified: Secondary | ICD-10-CM

## 2023-12-07 LAB — TSH: TSH: 1 u[IU]/mL (ref 0.35–5.50)

## 2023-12-07 LAB — VITAMIN B12: Vitamin B-12: 238 pg/mL (ref 211–911)

## 2023-12-07 LAB — VITAMIN D 25 HYDROXY (VIT D DEFICIENCY, FRACTURES): VITD: 19.46 ng/mL — ABNORMAL LOW (ref 30.00–100.00)

## 2023-12-07 MED ORDER — VITAMIN D (ERGOCALCIFEROL) 1.25 MG (50000 UNIT) PO CAPS
50000.0000 [IU] | ORAL_CAPSULE | ORAL | 0 refills | Status: AC
Start: 2023-12-07 — End: ?

## 2023-12-07 MED ORDER — SERTRALINE HCL 50 MG PO TABS
50.0000 mg | ORAL_TABLET | Freq: Every day | ORAL | 0 refills | Status: AC
Start: 2023-12-07 — End: ?

## 2023-12-07 NOTE — Progress Notes (Signed)
 New Patient Office Visit  Subjective    Patient ID: Ruth Werner, female    DOB: Oct 07, 2000  Age: 23 y.o. MRN: [id number]  CC:  Chief Complaint  Patient presents with   New Patient (Initial Visit)    Establish care    HPI Ruth Werner is a 23 y.o. female presents to establish care.  Previous pcp/physical/labs: no recent physical. [facility] in [place]. Labs at [facility] every two months.   Discussed the use of AI scribe software for clinical note transcription with the patient, who gave verbal consent to proceed.  History of Present Illness Ruth Werner is a 23 year old female with PCOS and infertility who presents with anxiety and depression.  She has a history of polycystic ovary syndrome (PCOS) and infertility, diagnosed after being unable to conceive naturally for six months starting in November 2021. She has undergone various fertility treatments, including hormone injections, which she associates with her mood changes. She has taken a break from fertility treatments since her last miscarriage in May 2025.  She has been experiencing anxiety and depression for approximately the last year, with symptoms worsening since the summer of 2024. She describes feeling down, having low energy, and experiencing emotional distress. Her mood issues have been exacerbated by her struggles with infertility and multiple pregnancy losses, including ectopic pregnancies in July 2023 and March 2024, and a miscarriage in May 2025. These mood disturbances affect her daily life and work, with some days being worse than others.  She has a history of vitamin B deficiency, for which she previously received injections and a prescription dosage from the fertility clinic. However, she has not had her vitamin levels checked recently. She also mentions a family history of thyroid issues, with her father's thyroid being low, although her own thyroid levels have been normal in past checks.      Outpatient Encounter Medications as of 12/07/2023  Medication Sig   Choriogonadotropin Alfa (OVIDREL) 250 MCG/0.5ML SOSY Inject into the skin.   FOLLISTIM AQ 600 UNT/0.72ML SOLN INJECT 75 IU SUBCUTANEOUSLY DAILY AS DIRECTED   letrozole (FEMARA) 2.5 MG tablet Take 10 mg by mouth daily.   Menotropins (MENOPUR) 75 units SOLR Inject into the skin.   metformin (FORTAMET) 1000 MG (OSM) 24 hr tablet Take 2,000 mg by mouth daily with breakfast.   naproxen  (NAPROSYN ) 500 MG tablet Take 1 tablet (500 mg total) by mouth 2 (two) times daily.   ondansetron  (ZOFRAN -ODT) 4 MG disintegrating tablet Take 1-2 tablets (4-8 mg total) by mouth every 8 (eight) hours as needed for nausea. Or vertigo.   sertraline  (ZOLOFT ) 50 MG tablet Take 1 tablet (50 mg total) by mouth daily.   [DISCONTINUED] amoxicillin -clavulanate (AUGMENTIN ) 875-125 MG tablet Take 1 tablet by mouth every 12 (twelve) hours.   [DISCONTINUED] fluticasone  (FLONASE ) 50 MCG/ACT nasal spray Place 2 sprays into both nostrils daily.   [DISCONTINUED] meclizine (ANTIVERT) 25 MG tablet Take 25 mg by mouth daily as needed.   No facility-administered encounter medications on file as of 12/07/2023.    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 perfumes    Past Surgical History:  Procedure Laterality Date   APPENDECTOMY     SHOULDER SURGERY     TONSILLECTOMY     WISDOM TOOTH  EXTRACTION      Family History  Problem Relation Age of Onset   Migraines Mother    Healthy Father     Social History   Socioeconomic History   Marital status: Single    Spouse name: Not on file   Number of children: Not on file   Years of education: Not on file   Highest education level: Not on file  Occupational History   Not on file  Tobacco Use   Smoking  status: Never   Smokeless tobacco: Never  Vaping Use   Vaping status: Never Used  Substance and Sexual Activity   Alcohol use: No   Drug use: No   Sexual activity: Yes  Other Topics Concern   Not on file  Social History Narrative   Not on file   Social Drivers of Health   Financial Resource Strain: Not on file  Food Insecurity: Not on file  Transportation Needs: Not on file  Physical Activity: Not on file  Stress: Not on file  Social Connections: Not on file  Intimate Partner Violence: Not on file    Review of Systems  Constitutional:  Positive for malaise/fatigue. Negative for chills and fever.  Respiratory:  Negative for shortness of breath.   Cardiovascular:  Negative for chest pain.  Gastrointestinal:  Negative for abdominal pain, constipation, diarrhea, heartburn, nausea and vomiting.  Genitourinary:  Negative for dysuria, frequency and urgency.  Neurological:  Negative for dizziness and headaches.  Endo/Heme/Allergies:  Negative for polydipsia.  Psychiatric/Behavioral:  Positive for depression. Negative for suicidal ideas. The patient is not nervous/anxious.         Objective    BP 104/62   Pulse (!) 102   Temp 98.1 F (36.7 C) (Oral)   Ht 6' 1 (1.854 m)   Wt 244 lb (110.7 kg)   LMP 12/07/2023 (Exact Date)   SpO2 98%   BMI 32.19 kg/m   Physical Exam Vitals and nursing note reviewed.  Constitutional:      Appearance: Normal appearance.  Cardiovascular:     Rate and Rhythm: Normal rate and regular rhythm.     Pulses: Normal pulses.     Heart sounds: Normal heart sounds.  Pulmonary:     Effort: Pulmonary effort is normal.     Breath sounds: Normal breath sounds.  Neurological:     Mental Status: She is alert and oriented to person, place, and time.  Psychiatric:        Mood and Affect: Mood normal.        Behavior: Behavior normal.        Thought Content: Thought content normal.        Judgment: Judgment normal.     Comments: tearful          Assessment & Plan:  Anxiety and depression -     Sertraline  HCl; Take 1 tablet (50 mg total) by mouth daily.  Dispense: 30 tablet; Refill: 0 -     TSH  Establishing care with new doctor, encounter for Assessment & Plan: EMR reviewed briefly.     Encounter for immunization -     Flu vaccine trivalent PF, 6mos and older(Flulaval,Afluria,Fluarix,Fluzone)  Vitamin D  deficiency -     VITAMIN D  25 Hydroxy (Vit-D Deficiency, Fractures)  B12 deficiency -     Vitamin B12  Infertility, female Assessment & Plan: Following with [facility].      Assessment and Plan Assessment & Plan Depression and anxiety symptoms Symptoms persisted for one  year, worsened by infertility and miscarriages. Sertraline  chosen for safety in pregnancy and symptom relief. - Prescribed sertraline  25 mg daily for one week, then 50 mg daily. - Advised taking sertraline  at night initially. - Scheduled follow-up in four weeks to assess response. - Provided sertraline  handout with side effects. - Ordered blood tests for vitamin D , B12, and thyroid function.  Vitamin D  and B deficiencies Previous vitamin B deficiency treated with injections.  - Ordered blood tests for vitamin D  and B12 levels. - Consider vitamin supplementation if deficiencies confirmed.   Return in about 4 weeks (around 01/04/2024) for anxiety and depression.   Carrol Aurora, NP

## 2023-12-07 NOTE — Assessment & Plan Note (Signed)
 Following with [facility].

## 2023-12-07 NOTE — Assessment & Plan Note (Signed)
 EMR reviewed briefly.

## 2023-12-07 NOTE — Patient Instructions (Addendum)
 Stop by the lab prior to leaving today. I will notify you of your results once received.    Start Sertraline  25 mg once daily at bedtime for one week and then increase to 50 mg there after.   Follow up in 4 weeks.   It was a pleasure to meet you today! Please don't hesitate to contact me with any questions. Welcome to [facility]!

## 2023-12-26 ENCOUNTER — Ambulatory Visit: Payer: Self-pay

## 2023-12-26 NOTE — Telephone Encounter (Signed)
Noted and will evaluate.  

## 2023-12-26 NOTE — Telephone Encounter (Signed)
 FYI Only or Action Required?: Action required by provider: clinical question for provider and update on patient condition.  Patient was last seen in primary care on 12/07/2023 by Ruth Shivers, NP.  Called Nurse Triage reporting Dizziness.  Symptoms began a week ago.  Interventions attempted: Nothing.  Symptoms are: unchanged.  Triage Disposition: See PCP When Office is Open (Within 3 Days)  Patient/caregiver understands and will follow disposition?: Yes  Copied from CRM #[id number]. Topic: Clinical - Red Word Triage >> Dec 26, 2023  9:30 AM Roselie BROCKS wrote: [facility] that prompted transfer to Nurse Triage: Patient states her blood pressure feels its really high, and she is very dizzy   Reason for Disposition  [1] MILD dizziness (e.g., walking normally) AND [2] has NOT been evaluated by doctor (or NP/PA) for this  (Exception: Dizziness caused by heat exposure, sudden standing, or poor fluid intake.)  Answer Assessment - Initial Assessment Questions Pt called in stating that since starting Zoloft  11/21, she has been more dizzy and feels it may be BP related. She states she has never had elevated BP but felt dizzy at work so it was checked manually and every day has been elevated. Reports Monday, 12/08 was the highest level of 144/92 while at work; reports no symptoms other than dizziness. Today BP 142/94 when she woke up, rechecked at 0742am when she got to work and BP 124/90. Pt states she is feeling positive effects from Zoloft  and does not want to d/c medication but would like to discuss with PCP if symptoms will subside or she will have to monitor BP while on medication. 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 Requested f/u from PCP via my chart as she is at work and unable to answer her phone. Please advise.    1. DESCRIPTION: Describe your dizziness.     Reports dizziness as lightheadedness; reports she has had vertigo in the past and  it is not that severe   2. LIGHTHEADED: Do you feel lightheaded? (e.g., somewhat faint, woozy, weak upon standing)     Dizzy, woozy  3. VERTIGO: Do you feel like either you or the room is spinning or tilting? (i.e., vertigo)     No  4. SEVERITY: How bad is it?  Do you feel like you are going to faint? Can you stand and walk?     No; reports she does not feel faint and has not had episode of LOC. Reports she just gets lightheaded and sits down for a minute   5. ONSET:  When did the dizziness begin?     Since starting Zoloft  11/21  6. AGGRAVATING FACTORS: Does anything make it worse? (e.g., standing, change in head position)     None   7. HEART RATE: Can you tell me your heart rate? How many beats in 15 seconds?  (Note: Not all patients can do this.)       N/a; pt is monitoring BP. States since starting Zoloft  her BP has gradually increased   8. CAUSE: What do you think is causing the dizziness? (e.g., decreased fluids or food, diarrhea, emotional distress, heat exposure, new medicine, sudden standing, vomiting; unknown)     Zoloft  initiation   9. RECURRENT SYMPTOM: Have you had dizziness before? If Yes, ask: When was the last time? What happened that time?     Hx of vertigo in the past   10. OTHER SYMPTOMS: Do you have  any other symptoms? (e.g., fever, chest pain, vomiting, diarrhea, bleeding)       None; denies h/a, changes in vision, CP, SOB  Protocols used: Dizziness - Lightheadedness-A-AH

## 2024-01-04 ENCOUNTER — Ambulatory Visit: Admitting: General Practice

## 2024-01-04 VITALS — BP 118/80 | HR 94 | Temp 97.9°F | Ht 73.0 in | Wt 243.0 lb

## 2024-01-04 DIAGNOSIS — F32A Depression, unspecified: Secondary | ICD-10-CM

## 2024-01-04 DIAGNOSIS — F419 Anxiety disorder, unspecified: Secondary | ICD-10-CM | POA: Diagnosis not present

## 2024-01-04 DIAGNOSIS — R42 Dizziness and giddiness: Secondary | ICD-10-CM | POA: Insufficient documentation

## 2024-01-04 DIAGNOSIS — E559 Vitamin D deficiency, unspecified: Secondary | ICD-10-CM

## 2024-01-04 MED ORDER — SERTRALINE HCL 50 MG PO TABS: 50.0000 mg | ORAL_TABLET | Freq: Every day | ORAL | 5 refills | Status: AC

## 2024-01-04 NOTE — Progress Notes (Signed)
 "  Established Patient Office Visit  Subjective   Patient ID: Ruth Werner, female    DOB: 2000-08-26  Age: 23 y.o. MRN: [id number]  Chief Complaint  Patient presents with   Anxiety    And depression f/u. Patient currently taking zoloft  50 mg tabs and is doing well.     Anxiety Patient reports no chest pain, dizziness, nausea, nervous/anxious behavior, shortness of breath or suicidal ideas.     Ruth Werner is a 23 year old female with past medical history migraine, IBS, vertigo, vitamin d  defciency, b 12 deficiency presents today for follow up.   Discussed the use of AI scribe software for clinical note transcription with the patient, who gave verbal consent to proceed.  History of Present Illness Ruth Werner is a 23 year old female with anxiety and depression who presents for follow-up of her management.  She started Zoloft  four weeks ago, initially at a half tablet for one week, and has been on 50 mg daily for the past two weeks. She notes a positive change in her symptoms, feeling less anxious and more patient, particularly in social situations and at work. She describes a previous reluctance to engage in activities, which has improved since starting the medication. She continues to experience some anxiety but feels it is more manageable.  She has been taking vitamin D  once a week due to previously low levels, with her last dose taken yesterday. She experienced a delay in starting the vitamin D  regimen but is now on track.  She experienced dizziness after starting Zoloft , which has since improved. She also noted elevated blood pressure over the past two weeks, which she attributes to stress, but it has normalized since earlier this week. She monitors her blood pressure daily and reports it is now stable. She has a history of vertigo but states the recent dizziness felt different and was not similar to her vertigo episodes.  She has not been taking naproxen  regularly, only  using it occasionally for back pain, and has not used it in the past year.     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   Establishing care with new doctor, encounter for 12/07/2023   Anxiety and depression 12/07/2023   Vitamin D  deficiency 12/07/2023   B12 deficiency 12/07/2023   Infertility, female 12/07/2023   Vertigo 10/13/2021   Irritable bowel syndrome 08/07/2021   Migraine without aura and without status migrainosus, not intractable 03/20/2021   Acute low back pain 04/08/2020   Past Medical History:  Diagnosis Date   Acne    Depression    Irritable bowel syndrome 08/07/2021   Migraine without aura and without status migrainosus, not intractable 03/20/2021   Predisposition to allergic reactions involving upper respiratory tract    to perfumes   Past Surgical History:  Procedure Laterality Date   APPENDECTOMY     SHOULDER SURGERY     TONSILLECTOMY     WISDOM TOOTH EXTRACTION     Allergies[1]       01/04/2024    2:42 PM 12/07/2023   10:28 AM  Depression screen PHQ 2/9  Decreased Interest 1 1  Down, Depressed, Hopeless 1 1  PHQ - 2 Score 2 2  Altered sleeping 2 2  Tired, decreased energy 1 2  Change in appetite 1 1  Feeling bad or failure about yourself  0 0  Trouble concentrating 1 1  Moving slowly or fidgety/restless 1 1  Suicidal thoughts 0 0  PHQ-9 Score 8 9  Difficult doing work/chores Somewhat difficult Somewhat difficult       01/04/2024    2:42 PM 12/07/2023   10:29 AM  GAD 7 : Generalized Anxiety Score  Nervous, Anxious, on Edge 1 2  Control/stop worrying 1 2  Worry too much - different things 1 2  Trouble relaxing 1 1  Restless 1 1  Easily annoyed or irritable 1 2  Afraid - awful might happen 0 0  Total GAD 7 Score 6 10  Anxiety Difficulty Somewhat difficult Somewhat difficult      Review of Systems  Constitutional:  Negative for chills and  fever.  Respiratory:  Negative for shortness of breath.   Cardiovascular:  Negative for chest pain.  Gastrointestinal:  Negative for abdominal pain, constipation, diarrhea, heartburn, nausea and vomiting.  Genitourinary:  Negative for dysuria, frequency and urgency.  Neurological:  Negative for dizziness and headaches.  Endo/Heme/Allergies:  Negative for polydipsia.  Psychiatric/Behavioral:  Negative for depression and suicidal ideas. The patient is not nervous/anxious.       Objective:     BP 118/80   Pulse 94   Temp 97.9 F (36.6 C) (Temporal)   Ht 6' 1 (1.854 m)   Wt 243 lb (110.2 kg)   LMP 01/03/2024   SpO2 97%   BMI 32.06 kg/m  BP Readings from Last 3 Encounters:  01/04/24 118/80  12/07/23 104/62  10/24/22 113/73   Wt Readings from Last 3 Encounters:  01/04/24 243 lb (110.2 kg)  12/07/23 244 lb (110.7 kg)  10/24/22 236 lb (107 kg)      Physical Exam Vitals and nursing note reviewed.  Constitutional:      Appearance: Normal appearance.  Cardiovascular:     Rate and Rhythm: Normal rate and regular rhythm.     Pulses: Normal pulses.     Heart sounds: Normal heart sounds.  Pulmonary:     Effort: Pulmonary effort is normal.     Breath sounds: Normal breath sounds.  Neurological:     Mental Status: She is alert and oriented to person, place, and time.  Psychiatric:        Mood and Affect: Mood normal.        Behavior: Behavior normal.        Thought Content: Thought content normal.        Judgment: Judgment normal.      No results found for any visits on 01/04/24.     The ASCVD Risk score (Arnett DK, et al., 2019) failed to calculate for the following reasons:   The 2019 ASCVD risk score is only valid for ages 60 to 52   * - Cholesterol units were assumed    Assessment & Plan:  Anxiety and depression -     Sertraline  HCl; Take 1 tablet (50 mg total) by mouth daily.  Dispense: 30 tablet; Refill: 5  Vitamin D  deficiency  Dizziness     Assessment and Plan Assessment & Plan Anxiety and depression Improving with sertraline  50 mg daily. Increased patience and reduced anxiety. Initial dizziness resolved. No dose increase needed. - Continue sertraline  50 mg oral daily at bedtime. - Monitor mood and anxiety levels over the next 2-3 months. If symptoms worsen, contact provider for potential dose adjustment.  Vitamin D  deficiency Managed with weekly supplementation. Requires refills for three months. Recheck levels in three months. - Continue vitamin D  supplementation once weekly. -  Refilled vitamin D  prescription monthly. - Recheck vitamin D  levels in three months.  Dizziness Initial dizziness after starting sertraline  resolved. Blood pressure normalized. No vertigo symptoms. - Continue to monitor blood pressure regularly.  General Health Maintenance Due for Pap smear, last in 2021. Prefers previous OB GYN for continuity. - Schedule Pap smear with previous OB GYN. - Follow up in six months for physical exam and fasting labs.   Return in about 6 months (around 07/04/2024) for physical and fasting labs. SABRA Carrol Aurora, NP    [1] No Known Allergies  "

## 2024-01-04 NOTE — Patient Instructions (Addendum)
 Continue zoloft  50 mg once daily at bedtime.   Continue Vitamin D . Have it checked at the fertility doctor's office and have it faxed to me.   Follow up in 6 months.   It was a pleasure to see you today!

## 2024-01-13 ENCOUNTER — Ambulatory Visit
Admission: EM | Admit: 2024-01-13 | Discharge: 2024-01-13 | Disposition: A | Discharge status: Home / Self Care | Attending: Emergency Medicine | Admitting: Emergency Medicine

## 2024-01-13 ENCOUNTER — Other Ambulatory Visit: Payer: Self-pay

## 2024-01-13 DIAGNOSIS — J069 Acute upper respiratory infection, unspecified: Secondary | ICD-10-CM

## 2024-01-13 DIAGNOSIS — J0100 Acute maxillary sinusitis, unspecified: Secondary | ICD-10-CM

## 2024-01-13 MED ORDER — AMOXICILLIN-POT CLAVULANATE 875-125 MG PO TABS: 1.0000 | ORAL_TABLET | Freq: Two times a day (BID) | ORAL | 0 refills | Status: AC

## 2024-01-13 NOTE — ED Triage Notes (Signed)
 Pt is here with a sore throat, and cough, fever that started a week ago, pt has taken OTC meds to relieve discomfort. Pt states she tested NEGATIVE on Thursday for COVID and Flu.

## 2024-01-13 NOTE — Discharge Instructions (Addendum)
 Take the Augmentin  as directed.  Follow up with your primary care provider.  Go to the emergency department if you have worsening symptoms.

## 2024-01-13 NOTE — ED Provider Notes (Signed)
 " [facility]    CSN: [id number] Arrival date & time: 01/13/24  9063      History   Chief Complaint Chief Complaint  Patient presents with   Fever    HPI Dora Simeone Waddill is a 23 y.o. female.  Patient presents with 1 week history of fever, congestion, postnasal drainage, sinus pressure, sore throat, cough.  She has been treating her symptoms with OTC cold medication.  No fever in the last 48 hours.  No shortness of breath, vomiting, diarrhea.  Her medical history includes migraine headaches and irritable bowel syndrome.  The history is provided by the patient and medical records.    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   Establishing care with new doctor, encounter for 12/07/2023   Anxiety and depression 12/07/2023   Vitamin D  deficiency 12/07/2023   B12 deficiency 12/07/2023   Infertility, female 12/07/2023   Vertigo 10/13/2021   Irritable bowel syndrome 08/07/2021   Migraine without aura and without status migrainosus, not intractable 03/20/2021   Acute low back pain 04/08/2020    Past Surgical History:  Procedure Laterality Date   APPENDECTOMY     SHOULDER SURGERY     TONSILLECTOMY     WISDOM TOOTH EXTRACTION      OB History     Gravida  1   Para  0   Term  0   Preterm  0   AB  0   Living  0      SAB  0   IAB  0   Ectopic  0   Multiple  0   Live Births  0            Home Medications    Prior to Admission medications  Medication Sig Start Date End Date Taking? Authorizing Provider  amoxicillin -clavulanate (AUGMENTIN ) 875-125 MG tablet Take 1 tablet by mouth  every 12 (twelve) hours. 01/13/24  Yes Corlis Burnard DEL, NP  Choriogonadotropin Alfa (OVIDREL) 250 MCG/0.5ML SOSY Inject into the skin.    [provider]  FOLLISTIM AQ 600 UNT/0.72ML SOLN INJECT 75 IU SUBCUTANEOUSLY DAILY AS DIRECTED 10/10/23   [provider]  gabapentin (NEURONTIN) 300 MG capsule Take 300 mg by mouth 3 (three) times daily as needed. 12/11/23   [provider]  letrozole (FEMARA) 2.5 MG tablet Take 10 mg by mouth daily.    [provider]  Menotropins (MENOPUR) 75 units SOLR Inject into the skin.    [provider]  metformin (FORTAMET) 1000 MG (OSM) 24 hr tablet Take 2,000 mg by mouth daily with breakfast.    [provider]  naproxen  (NAPROSYN ) 500 MG tablet Take 1 tablet (500 mg total) by mouth 2 (two) times daily. 10/19/22   Van Knee, MD  ondansetron  (ZOFRAN -ODT) 4 MG disintegrating tablet Take 1-2 tablets (4-8 mg total) by mouth every 8 (eight) hours as needed for nausea. Or vertigo. 10/13/21   Ines Onetha NOVAK, MD  sertraline  (ZOLOFT ) 50 MG tablet Take 1 tablet (50 mg total) by mouth daily. 01/04/24   Vincente Shivers, NP    Family History Family History  Problem Relation Age of Onset   Heart disease Mother    Migraines Mother    Hypertension Father    Hyperlipidemia Father    COPD Father    Liver disease Father    Hyperlipidemia Brother    Heart disease Maternal Grandmother    Heart attack Maternal Grandfather    COPD Paternal Grandfather     Social History Social History[1]   Allergies   Patient has no known allergies.   Review of Systems Review of Systems  Constitutional:  Positive for fever. Negative for chills.  HENT:  Positive for congestion, postnasal drip, rhinorrhea, sinus pressure and sore throat. Negative for ear pain.   Respiratory:  Positive for cough. Negative for shortness of breath.   Gastrointestinal:  Negative for diarrhea and vomiting.     Physical Exam Triage Vital Signs ED  Triage Vitals  Encounter Vitals Group     BP 01/13/24 1121 124/83     Girls Systolic BP Percentile --      Girls Diastolic BP Percentile --      Boys Systolic BP Percentile --      Boys Diastolic BP Percentile --      Pulse Rate 01/13/24 1121 96     Resp 01/13/24 1121 (!) 21     Temp 01/13/24 1121 98 F (36.7 C)     Temp Source 01/13/24 1121 Oral     SpO2 01/13/24 1121 98 %     Weight --      Height --      Head Circumference --      Peak Flow --      Pain Score 01/13/24 1120 0     Pain Loc --      Pain Education --      Exclude from Growth Chart --    No data found.  Updated Vital Signs BP 124/83 (BP Location: Right Arm)   Pulse 96   Temp 98 F (36.7 C) (Oral)   Resp (!) 21   LMP 01/03/2024   SpO2 98%   Visual Acuity Right Eye Distance:   Left Eye Distance:   Bilateral Distance:    Right Eye Near:   Left Eye Near:    Bilateral Near:     Physical Exam Constitutional:      General: She is not in acute distress. HENT:     Right Ear: Tympanic membrane normal.     Left Ear: Tympanic membrane normal.     Nose: Congestion and rhinorrhea present.     Mouth/Throat:     Mouth: Mucous membranes are moist.     Pharynx: Oropharynx is clear.  Cardiovascular:     Rate and Rhythm: Normal rate and regular rhythm.     Heart sounds: Normal heart sounds.  Pulmonary:     Effort: Pulmonary effort is normal. No respiratory distress.     Breath sounds: Normal breath sounds.  Neurological:     Mental Status: She is alert.      UC Treatments / Results  Labs (all labs ordered are listed, but only abnormal results are displayed) Labs Reviewed - No data to display  EKG   Radiology No results found.  Procedures Procedures (including critical care time)  Medications Ordered in UC Medications - No data to display  Initial Impression / Assessment and Plan / UC Course  I have reviewed the triage vital signs and the nursing notes.  Pertinent labs & imaging results  that were available during my care  of the patient were reviewed by me and considered in my medical decision making (see chart for details).    Acute sinusitis and upper respiratory infection.  Afebrile and vital signs are stable.  Lungs are clear and O2 sat is 98% on room air.  Patient has been symptomatic for a week and is not improving with OTC treatment.  Treating today with Augmentin .  Tylenol or ibuprofen  as needed, plain Mucinex as needed.  Instructed her to follow-up with her PCP.  ED precautions given.  Education provided on sinus infection and upper respiratory infection.  She agrees to plan of care  Final Clinical Impressions(s) / UC Diagnoses   Final diagnoses:  Acute non-recurrent maxillary sinusitis  Acute upper respiratory infection     Discharge Instructions      Take the Augmentin  as directed.  Follow up with your primary care provider.  Go to the emergency department if you have worsening symptoms.        ED Prescriptions     Medication Sig Dispense Auth. Provider   amoxicillin -clavulanate (AUGMENTIN ) 875-125 MG tablet Take 1 tablet by mouth every 12 (twelve) hours. 14 tablet Corlis Burnard DEL, NP      PDMP not reviewed this encounter.    [1]  Social History Tobacco Use   Smoking status: Never   Smokeless tobacco: Never  Vaping Use   Vaping status: Never Used  Substance Use Topics   Alcohol use: No   Drug use: Never     Corlis Burnard DEL, NP 01/13/24 1208  "

## 2024-02-03 ENCOUNTER — Ambulatory Visit: Payer: Self-pay

## 2024-07-07 ENCOUNTER — Encounter: Admitting: General Practice
